# Patient Record
Sex: Male | Born: 1965 | Race: Black or African American | Hispanic: No | Marital: Married | Smoking: Never smoker
Health system: Southern US, Community
[De-identification: ages and names within clinical notes are randomized; demographics above are authoritative.]

## PROBLEM LIST (undated history)

## (undated) DIAGNOSIS — I10 Essential (primary) hypertension: Secondary | ICD-10-CM

## (undated) DIAGNOSIS — J45909 Unspecified asthma, uncomplicated: Secondary | ICD-10-CM

## (undated) DIAGNOSIS — E66813 Obesity, class 3: Secondary | ICD-10-CM

## (undated) HISTORY — DX: Unspecified asthma, uncomplicated: J45.909

## (undated) HISTORY — DX: Obesity, class 3: E66.813

## (undated) HISTORY — PX: MOUTH SURGERY: SHX715

## (undated) HISTORY — DX: Morbid (severe) obesity due to excess calories: E66.01

## (undated) HISTORY — PX: TONSILLECTOMY: SUR1361

---

## 2011-09-15 ENCOUNTER — Inpatient Hospital Stay (HOSPITAL_COMMUNITY)
Admission: EM | Admit: 2011-09-15 | Discharge: 2011-09-20 | DRG: 189 | Disposition: A | Discharge status: Home / Self Care | Payer: Medicaid Other | Attending: Pulmonary Disease | Admitting: Pulmonary Disease

## 2011-09-15 ENCOUNTER — Emergency Department (HOSPITAL_COMMUNITY): Payer: Medicaid Other

## 2011-09-15 ENCOUNTER — Encounter (HOSPITAL_COMMUNITY): Payer: Self-pay | Admitting: Emergency Medicine

## 2011-09-15 DIAGNOSIS — Z6841 Body Mass Index (BMI) 40.0 and over, adult: Secondary | ICD-10-CM

## 2011-09-15 DIAGNOSIS — R0989 Other specified symptoms and signs involving the circulatory and respiratory systems: Secondary | ICD-10-CM

## 2011-09-15 DIAGNOSIS — J9600 Acute respiratory failure, unspecified whether with hypoxia or hypercapnia: Principal | ICD-10-CM

## 2011-09-15 DIAGNOSIS — N179 Acute kidney failure, unspecified: Secondary | ICD-10-CM

## 2011-09-15 DIAGNOSIS — R0902 Hypoxemia: Secondary | ICD-10-CM

## 2011-09-15 DIAGNOSIS — R0603 Acute respiratory distress: Secondary | ICD-10-CM

## 2011-09-15 DIAGNOSIS — I10 Essential (primary) hypertension: Secondary | ICD-10-CM | POA: Diagnosis present

## 2011-09-15 DIAGNOSIS — R778 Other specified abnormalities of plasma proteins: Secondary | ICD-10-CM | POA: Diagnosis present

## 2011-09-15 DIAGNOSIS — I214 Non-ST elevation (NSTEMI) myocardial infarction: Secondary | ICD-10-CM

## 2011-09-15 DIAGNOSIS — I959 Hypotension, unspecified: Secondary | ICD-10-CM | POA: Diagnosis present

## 2011-09-15 DIAGNOSIS — R799 Abnormal finding of blood chemistry, unspecified: Secondary | ICD-10-CM | POA: Diagnosis present

## 2011-09-15 DIAGNOSIS — R34 Anuria and oliguria: Secondary | ICD-10-CM | POA: Diagnosis present

## 2011-09-15 DIAGNOSIS — R0609 Other forms of dyspnea: Secondary | ICD-10-CM

## 2011-09-15 DIAGNOSIS — J189 Pneumonia, unspecified organism: Secondary | ICD-10-CM

## 2011-09-15 DIAGNOSIS — I2699 Other pulmonary embolism without acute cor pulmonale: Secondary | ICD-10-CM | POA: Diagnosis present

## 2011-09-15 DIAGNOSIS — R7989 Other specified abnormal findings of blood chemistry: Secondary | ICD-10-CM

## 2011-09-15 HISTORY — DX: Essential (primary) hypertension: I10

## 2011-09-15 LAB — COMPREHENSIVE METABOLIC PANEL
ALT: 15 U/L (ref 0–53)
AST: 21 U/L (ref 0–37)
CO2: 22 mEq/L (ref 19–32)
Chloride: 100 mEq/L (ref 96–112)
Creatinine, Ser: 1.3 mg/dL (ref 0.50–1.35)
GFR calc Af Amer: 75 mL/min — ABNORMAL LOW (ref >90)
GFR calc non Af Amer: 65 mL/min — ABNORMAL LOW (ref >90)
Glucose, Bld: 175 mg/dL — ABNORMAL HIGH (ref 70–99)
Sodium: 138 mEq/L (ref 135–145)
Total Bilirubin: 0.4 mg/dL (ref 0.3–1.2)

## 2011-09-15 LAB — CARDIAC PANEL(CRET KIN+CKTOT+MB+TROPI)
CK, MB: 6.2 ng/mL (ref 0.3–4.0)
Relative Index: 2.4 (ref 0.0–2.5)
Troponin I: 1.52 ng/mL (ref <0.30)

## 2011-09-15 LAB — CBC
HCT: 38.3 % — ABNORMAL LOW (ref 39.0–52.0)
Hemoglobin: 12.2 g/dL — ABNORMAL LOW (ref 13.0–17.0)
MCH: 24.9 pg — ABNORMAL LOW (ref 26.0–34.0)
MCHC: 31.9 g/dL (ref 30.0–36.0)
RBC: 4.9 MIL/uL (ref 4.22–5.81)

## 2011-09-15 LAB — BLOOD GAS, ARTERIAL
Bicarbonate: 21.1 mEq/L (ref 20.0–24.0)
Expiratory PAP: 8
pH, Arterial: 7.378 (ref 7.350–7.450)
pO2, Arterial: 114 mmHg — ABNORMAL HIGH (ref 80.0–100.0)

## 2011-09-15 LAB — DIFFERENTIAL
Eosinophils Absolute: 0.1 10*3/uL (ref 0.0–0.7)
Lymphs Abs: 2.6 10*3/uL (ref 0.7–4.0)
Monocytes Absolute: 0.8 10*3/uL (ref 0.1–1.0)
Monocytes Relative: 4 % (ref 3–12)
Neutrophils Relative %: 82 % — ABNORMAL HIGH (ref 43–77)

## 2011-09-15 LAB — URINALYSIS, ROUTINE W REFLEX MICROSCOPIC
Ketones, ur: NEGATIVE mg/dL
Leukocytes, UA: NEGATIVE
Nitrite: NEGATIVE
Protein, ur: 100 mg/dL — AB
Urobilinogen, UA: 1 mg/dL (ref 0.0–1.0)

## 2011-09-15 LAB — PRO B NATRIURETIC PEPTIDE: Pro B Natriuretic peptide (BNP): 1144 pg/mL — ABNORMAL HIGH (ref 0–125)

## 2011-09-15 LAB — D-DIMER, QUANTITATIVE: D-Dimer, Quant: 12.67 ug/mL-FEU — ABNORMAL HIGH (ref 0.00–0.48)

## 2011-09-15 LAB — PROCALCITONIN: Procalcitonin: 1.64 ng/mL

## 2011-09-15 LAB — LACTIC ACID, PLASMA: Lactic Acid, Venous: 4.9 mmol/L — ABNORMAL HIGH (ref 0.5–2.2)

## 2011-09-15 LAB — URINE MICROSCOPIC-ADD ON

## 2011-09-15 MED ORDER — DEXTROSE 5 % IV SOLN
1.0000 g | Freq: Every day | INTRAVENOUS | Status: DC
  Administered 2011-09-15 – 2011-09-16 (×2): 1 g via INTRAVENOUS
  Filled 2011-09-15 (×2): qty 10

## 2011-09-15 MED ORDER — SODIUM CHLORIDE 0.9 % IV BOLUS (SEPSIS)
1000.0000 mL | Freq: Once | INTRAVENOUS | Status: AC
  Administered 2011-09-15: 1000 mL via INTRAVENOUS

## 2011-09-15 MED ORDER — PIPERACILLIN-TAZOBACTAM 3.375 G IVPB: 3.3750 g | Freq: Once | INTRAVENOUS | Status: DC

## 2011-09-15 MED ORDER — SODIUM CHLORIDE 0.9 % IV SOLN
INTRAVENOUS | Status: DC
  Administered 2011-09-15: 19:00:00 via INTRAVENOUS

## 2011-09-15 MED ORDER — PIPERACILLIN-TAZOBACTAM 3.375 G IVPB 30 MIN
3.3750 g | Freq: Once | INTRAVENOUS | Status: AC
  Administered 2011-09-15: 3.375 g via INTRAVENOUS
  Filled 2011-09-15: qty 50

## 2011-09-15 MED ORDER — SODIUM CHLORIDE 0.9 % IV SOLN: 250.0000 mL | INTRAVENOUS | Status: DC | PRN

## 2011-09-15 MED ORDER — SODIUM CHLORIDE 0.9 % IV SOLN
INTRAVENOUS | Status: DC
  Administered 2011-09-15 – 2011-09-17 (×5): via INTRAVENOUS

## 2011-09-15 MED ORDER — ASPIRIN 300 MG RE SUPP
300.0000 mg | RECTAL | Status: AC
  Filled 2011-09-15: qty 1

## 2011-09-15 MED ORDER — VANCOMYCIN HCL IN DEXTROSE 1-5 GM/200ML-% IV SOLN
1000.0000 mg | Freq: Once | INTRAVENOUS | Status: AC
  Administered 2011-09-15: 1000 mg via INTRAVENOUS
  Filled 2011-09-15: qty 200

## 2011-09-15 MED ORDER — HEPARIN (PORCINE) IN NACL 100-0.45 UNIT/ML-% IJ SOLN
2000.0000 [IU]/h | INTRAMUSCULAR | Status: DC
  Administered 2011-09-15 – 2011-09-16 (×3): 2000 [IU]/h via INTRAVENOUS
  Filled 2011-09-15 (×6): qty 250

## 2011-09-15 MED ORDER — HEPARIN BOLUS VIA INFUSION
5000.0000 [IU] | Freq: Once | INTRAVENOUS | Status: AC
  Administered 2011-09-15: 5000 [IU] via INTRAVENOUS
  Filled 2011-09-15: qty 5000

## 2011-09-15 MED ORDER — ALBUTEROL SULFATE (5 MG/ML) 0.5% IN NEBU
5.0000 mg | INHALATION_SOLUTION | Freq: Once | RESPIRATORY_TRACT | Status: AC
  Administered 2011-09-15: 5 mg via RESPIRATORY_TRACT
  Filled 2011-09-15: qty 1

## 2011-09-15 MED ORDER — DEXTROSE 5 % IV SOLN
500.0000 mg | INTRAVENOUS | Status: DC
  Administered 2011-09-15: 500 mg via INTRAVENOUS
  Filled 2011-09-15 (×2): qty 500

## 2011-09-15 MED ORDER — ASPIRIN 81 MG PO CHEW
324.0000 mg | CHEWABLE_TABLET | ORAL | Status: AC
  Administered 2011-09-15: 324 mg via ORAL
  Filled 2011-09-15: qty 4

## 2011-09-15 MED ORDER — ALBUTEROL SULFATE (5 MG/ML) 0.5% IN NEBU
2.5000 mg | INHALATION_SOLUTION | RESPIRATORY_TRACT | Status: DC
  Administered 2011-09-15 – 2011-09-16 (×3): 2.5 mg via RESPIRATORY_TRACT
  Filled 2011-09-15 (×4): qty 0.5

## 2011-09-15 MED ORDER — DEXTROSE 5 % IV SOLN: 500.0000 mg | INTRAVENOUS | Status: DC

## 2011-09-15 MED ORDER — IPRATROPIUM BROMIDE 0.02 % IN SOLN
0.5000 mg | Freq: Once | RESPIRATORY_TRACT | Status: AC
  Administered 2011-09-15: 0.5 mg via RESPIRATORY_TRACT
  Filled 2011-09-15: qty 2.5

## 2011-09-15 MED ORDER — IPRATROPIUM BROMIDE 0.02 % IN SOLN
0.5000 mg | RESPIRATORY_TRACT | Status: DC
  Administered 2011-09-15 – 2011-09-16 (×3): 0.5 mg via RESPIRATORY_TRACT
  Filled 2011-09-15 (×4): qty 2.5

## 2011-09-15 NOTE — Progress Notes (Signed)
CRITICAL VALUE ALERT  Critical value received:  CKMB 6.2 troponin 1.52  Date of notification:  09/15/11  Time of notification:  23:35  Critical value read back:yes  Nurse who received alert:  Haskell Flirt, RN MD notified (1st page):  [place]  Time of first page:  23:35  MD notified (2nd page):  Time of second page:  Responding MD:  Delton Coombes  Time MD responded:  23:35

## 2011-09-15 NOTE — ED Notes (Signed)
MD Tyson Alias aware of patient vital signs, 2nd fluid bolus initiated

## 2011-09-15 NOTE — ED Provider Notes (Signed)
History     CSN: [id number]  Arrival date & time 09/15/11  1704   First MD Initiated Contact with Patient 09/15/11 1708      Chief Complaint  Patient presents with  . Shortness of Breath    (Consider location/radiation/quality/duration/timing/severity/associated sxs/prior treatment) Patient is a 46 y.o. male presenting with shortness of breath. The history is provided by the patient.  Shortness of Breath  Associated symptoms include cough and shortness of breath. Pertinent negatives include no chest pain, no fever and no sore throat.  pt c/o progressive sob in past day. Non productive cough. No fever/chills. No chest pain or discomfort. Pt morbidly obese, states legs always swollen, denies recent change or leg pain. No dvt or pe hx. No hx chf. ?orthopnea. No pnd. Hx htn. Denies recent change in meds or non compliance w meds. Pt unaware of recent wt change. Sob at rest, worse w attempted walking or lying flat. Denies hx asthma or copd, denies allergy problems. No hx inhaler use.   Past Medical History  Diagnosis Date  . Hypertension     No past surgical history on file.  No family history on file.  History  Substance Use Topics  . Smoking status: Not on file  . Smokeless tobacco: Not on file  . Alcohol Use:       Review of Systems  Constitutional: Negative for fever and chills.  HENT: Negative for sore throat and neck pain.   Eyes: Negative for discharge and redness.  Respiratory: Positive for cough and shortness of breath.   Cardiovascular: Negative for chest pain.  Gastrointestinal: Negative for vomiting and abdominal pain.  Genitourinary: Negative for flank pain.  Musculoskeletal: Negative for back pain.  Skin: Negative for rash.  Neurological: Negative for headaches.  Hematological: Does not bruise/bleed easily.  Psychiatric/Behavioral: Negative for confusion.    Allergies  Review of patient's allergies indicates no known allergies.  Home Medications  No  current outpatient prescriptions on file.  BP 108/69  Pulse 132  Resp 32  SpO2 96%  Physical Exam  Nursing note and vitals reviewed. Constitutional: He is oriented to person, place, and time. He appears well-developed and well-nourished. He appears distressed.  HENT:  Head: Atraumatic.  Nose: Nose normal.  Mouth/Throat: Oropharynx is clear and moist.  Eyes: Pupils are equal, round, and reactive to light.  Neck: Neck supple. No tracheal deviation present.       No stiffness or rigidity  Cardiovascular: Normal rate, regular rhythm, normal heart sounds and intact distal pulses.  Exam reveals no gallop and no friction rub.   No murmur heard. Pulmonary/Chest: No accessory muscle usage. He is in respiratory distress.       Distant/difficult to hear breath sounds/morbidly obese, no focal rales, ?sl wheeze/diminished air exchange.   Abdominal: Soft. Bowel sounds are normal. He exhibits no distension. There is no tenderness.       Morbidly obese  Musculoskeletal: Normal range of motion.       Pt morbidly obese, bil leg swelling/symmetric.   Neurological: He is alert and oriented to person, place, and time.  Skin: Skin is warm and dry. He is not diaphoretic.    ED Course  Procedures (including critical care time)   Labs Reviewed  CBC  DIFFERENTIAL  TROPONIN I  PRO B NATRIURETIC PEPTIDE  COMPREHENSIVE METABOLIC PANEL  BLOOD GAS, ARTERIAL    Results for orders placed during the hospital encounter of 09/15/11  CBC      Component Value Range  WBC 19.6 (*) 4.0 - 10.5 (K/uL)   RBC 4.90  4.22 - 5.81 (MIL/uL)   Hemoglobin 12.2 (*) 13.0 - 17.0 (g/dL)   HCT 16.1 (*) 09.6 - 52.0 (%)   MCV 78.2  78.0 - 100.0 (fL)   MCH 24.9 (*) 26.0 - 34.0 (pg)   MCHC 31.9  30.0 - 36.0 (g/dL)   RDW 04.5  40.9 - 81.1 (%)   Platelets 316  150 - 400 (K/uL)  DIFFERENTIAL      Component Value Range   Neutrophils Relative 82 (*) 43 - 77 (%)   Neutro Abs 16.1 (*) 1.7 - 7.7 (K/uL)   Lymphocytes Relative  13  12 - 46 (%)   Lymphs Abs 2.6  0.7 - 4.0 (K/uL)   Monocytes Relative 4  3 - 12 (%)   Monocytes Absolute 0.8  0.1 - 1.0 (K/uL)   Eosinophils Relative 0  0 - 5 (%)   Eosinophils Absolute 0.1  0.0 - 0.7 (K/uL)   Basophils Relative 0  0 - 1 (%)   Basophils Absolute 0.0  0.0 - 0.1 (K/uL)  TROPONIN I      Component Value Range   Troponin I 0.58 (*) <0.30 (ng/mL)  PRO B NATRIURETIC PEPTIDE      Component Value Range   Pro B Natriuretic peptide (BNP) 1144.0 (*) 0 - 125 (pg/mL)  COMPREHENSIVE METABOLIC PANEL      Component Value Range   Sodium 138  135 - 145 (mEq/L)   Potassium 3.4 (*) 3.5 - 5.1 (mEq/L)   Chloride 100  96 - 112 (mEq/L)   CO2 22  19 - 32 (mEq/L)   Glucose, Bld 175 (*) 70 - 99 (mg/dL)   BUN 12  6 - 23 (mg/dL)   Creatinine, Ser 9.14  0.50 - 1.35 (mg/dL)   Calcium 8.8  8.4 - 78.2 (mg/dL)   Total Protein 7.5  6.0 - 8.3 (g/dL)   Albumin 3.4 (*) 3.5 - 5.2 (g/dL)   AST 21  0 - 37 (U/L)   ALT 15  0 - 53 (U/L)   Alkaline Phosphatase 70  39 - 117 (U/L)   Total Bilirubin 0.4  0.3 - 1.2 (mg/dL)   GFR calc non Af Amer 65 (*) >90 (mL/min)   GFR calc Af Amer 75 (*) >90 (mL/min)  BLOOD GAS, ARTERIAL      Component Value Range   FIO2 1.00     Delivery systems BILEVEL POSITIVE AIRWAY PRESSURE     Mode BILEVEL POSITIVE AIRWAY PRESSURE     Rate 10     Inspiratory PAP 16     Expiratory PAP 8     pH, Arterial 7.378  7.350 - 7.450    pCO2 arterial 36.8  35.0 - 45.0 (mmHg)   pO2, Arterial 114.0 (*) 80.0 - 100.0 (mmHg)   Bicarbonate 21.1  20.0 - 24.0 (mEq/L)   TCO2 19.2  0 - 100 (mmol/L)   Acid-base deficit 3.0 (*) 0.0 - 2.0 (mmol/L)   O2 Saturation 97.1     Patient temperature 98.6     Collection site RIGHT RADIAL     Drawn by [id number]     Sample type ARTERIAL DRAW     Allens test (pass/fail) PASS  PASS   D-DIMER, QUANTITATIVE      Component Value Range   D-Dimer, Quant 12.67 (*) 0.00 - 0.48 (ug/mL-FEU)  LACTIC ACID, PLASMA      Component Value Range   Lactic Acid, Venous 4.9  (*)  0.5 - 2.2 (mmol/L)   Dg Chest Port 1 View  09/15/2011  *RADIOLOGY REPORT*  Clinical Data: Shortness of breath.  Respiratory distress.  PORTABLE CHEST - 1 VIEW  Comparison: None.  Findings: There is slight left perihilar haziness which may represent an infiltrate.  Suggestion of cardiomegaly.  Pulmonary vascularity is normal.  Right lung is clear.  IMPRESSION: Possible left perihilar infiltrate.  Cardiomegaly.  Original Report Authenticated By: Gwynn Burly, M.D.      MDM  Iv ns. Continuous pulse ox and monitor.  Stat pcxr. Labs. Additional hx from nursing and EMS.  Pt states progressive sob in past day. Denies hx same. No chest pain. Non productive cough today.     Date: 09/15/2011  Rate: 125  Rhythm: sinus tachycardia  QRS Axis: normal  Intervals: normal  ST/T Wave abnormalities: nonspecific ST/T changes  Conduction Disutrbances:none  Narrative Interpretation:   Old EKG Reviewed: none available  ?mild wheezing, albuterol/atrovent neb.  cxr with ?infiltrate on left, pt notes cough, will rx abx.  Zosyn and vanc iv.   Nursing notes now bp lower 85/50. Rechecked - low, ns boluses. pccm md on call paged - they will see in ed.   Recheck bp improved post ns bolus.   Recheck pt several times, remains conscious and alert, resp status appears relatively stable on bipap.  Additional hx from spouse now present, states pt w worsening cough for past several days, had felt bad/ill for several days. Given hx progressive cough, infiltrate on cxr, low bp - feel symptoms likely result of pna.   Given wt, cannot get ct chest. Critical care aware of labs (ddimer, trop) they will decide on anticoag.   Pt continues to deny any current or recent chest pain or discomfort.  CRITICAL CARE Performed by: Suzi Roots   Total critical care time: 100 minutes  Critical care time was exclusive of separately billable procedures and treating other patients.  Critical care was necessary to  treat or prevent imminent or life-threatening deterioration.  Critical care was time spent personally by me on the following activities: development of treatment plan with patient and/or surrogate as well as nursing, discussions with consultants, evaluation of patient's response to treatment, examination of patient, obtaining history from patient or surrogate, ordering and performing treatments and interventions, ordering and review of laboratory studies, ordering and review of radiographic studies, pulse oximetry and re-evaluation of patient's condition.        Suzi Roots, MD 09/15/11 [phone]

## 2011-09-15 NOTE — ED Notes (Signed)
Pt taken off bipap by MD, placed on NRB mask at 100%, respiratory at bedside, MD at bedside assessing patient

## 2011-09-15 NOTE — ED Notes (Signed)
MD at bedside. 

## 2011-09-15 NOTE — ED Notes (Signed)
Pt unable to answer triage questions due to respiratory distress.

## 2011-09-15 NOTE — H&P (Addendum)
Name: Seth Wilson MRN: [id number] DOB: November 27, 1965    LOS: 0  Referring Provider:  Denton Lank Reason for Referral:  Hypoxemic Respiratory Failure  PULMONARY / CRITICAL CARE MEDICINE  HPI:  46 y/o morbidly obese male with hypertension presented to [facility] on 5/19 in marked respiratory distress, hypoxemia and hypotension.  He was started on BIPAP, given IVF, and antibiotics and PCCM was called for admission.  He states that in the last week he had been feeling more short of breath and had fever, chills, and a cough productive of clear sputum.  He flew to [place] on 5/17 for a one day trip (after these symptoms had started) and felt worse after he returned.  He noted no new leg pain or chest pain after the flight.  He presented to the ED on 5/19 due to increasing dyspnea.  After receiving BIPAP, IVF and anti-biotics he was feeling much better.  He notes chronic leg edema which may be worse in the last few weeks.  Past Medical History  Diagnosis Date  . Hypertension    No past surgical history on file. Prior to Admission medications   Medication Sig Start Date End Date Taking? Authorizing Provider  amLODipine (NORVASC) 10 MG tablet Take 10 mg by mouth daily.   Yes Historical Provider, MD  Chlorphen-Phenyleph-ASA (ALKA-SELTZER PLUS COLD) 2-7.8-325 MG TBEF Take 2 tablets by mouth every 6 (six) hours as needed. For cold   Yes Historical Provider, MD  Furosemide (LASIX PO) Take 1 tablet by mouth daily.   Yes Historical Provider, MD   Allergies No Known Allergies  Family History No family history on file. Social History  does not have a smoking history on file. He does not have any smokeless tobacco history on file. His alcohol and drug histories not on file.  Review Of Systems:   Gen: Notes fever and chills, denies weight change, fatigue, night sweats HEENT: Denies blurred vision, double vision, hearing loss, tinnitus, sinus congestion, rhinorrhea, sore throat, neck stiffness, dysphagia PULM: per  hpi CV: Denies chest pain, notes some leg edema, notes some orthopnea, denies paroxysmal nocturnal dyspnea, palpitations GI: Denies abdominal pain, nausea, vomiting, diarrhea, hematochezia, melena, constipation, change in bowel habits GU: Denies dysuria, hematuria, polyuria, notes oliguria in the ED, urethral discharge Endocrine: Denies hot or cold intolerance, polyuria, polyphagia or appetite change Derm: Denies rash, dry skin, scaling or peeling skin change Heme: Denies easy bruising, bleeding, bleeding gums Neuro: Denies headache, numbness, weakness, slurred speech, loss of memory or consciousness   Brief patient description:   46 y/o obese male with hypoxemic respiratory failure admitted on 5/19 from the [facility]  His chills, elevated WBC and possible LUL infiltrate suggest community acquired pneumonia, but PE cannot be excluded.    Events Since Admission:   Current Status:  Vital Signs: Temp:  [100.2 F (37.9 C)-100.6 F (38.1 C)] 100.4 F (38 C) (05/19 2025) Pulse Rate:  [114-140] 114  (05/19 2025) Resp:  [20-40] 22  (05/19 2025) BP: (74-117)/(11-101) 117/101 mmHg (05/19 2025) SpO2:  [61 %-100 %] 96 % (05/19 2025)  Physical Examination: General:  Obese male, tachypnic but speaking in full sentences, no accessory muscle use Neuro:  Awake and alert, oriented x4, maew HEENT:  NCAT, PERRL, OP clear, MMC 4 Neck:  Supple, no masses Cardiovascular:  Tachy, regular, distant heart sounds, cannot assess JVD Lungs:  Insp crackles in bases bilaterally Abdomen:  Obese, bs+, soft, nontender Musculoskeletal:  Warm, good strength Skin:  Edema in legs bilaterally, no rash or  breakdown  Principal Problem:  *Acute respiratory failure Active Problems:  Community acquired pneumonia  Hypotension  Morbid obesity  Acute renal failure  NSTEMI (non-ST elevated myocardial infarction)   ASSESSMENT AND PLAN  PULMONARY  Lab 09/15/11 1735  PHART 7.378  PCO2ART 36.8  PO2ART 114.0*  HCO3  21.1  O2SAT 97.1   Ventilator Settings:   CXR:  Bilateral increased congestion, possible infiltrate LUL ETT:  n/a  A:  Acute hypoxemic respiratory failure in setting of cough, infiltrate and fever consistent with community acquired pneumonia.  However elevated d-dimer, morbid obesity and recent plane flight worrisome for acute PE.  Also with elevated troponin and BNP which either represents an acute PE or less likely acute coronary ischaemia with heart failure.  Overall, I think the most likely explanation is CAP with RV strain either from PE or perhaps chronic hypoxemia induced PH.  P:   -blood cultures, bio markers, and antibiotics as below -BIPAP off, he is much more comfortable since presentation to ED -f/u abg in am -wean off O2 as tolerated -empirically anticoagulate for PE and f/u results of TTE and LE doppler ultrasound  CARDIOVASCULAR  Lab 09/15/11 1800 09/15/11 1749  TROPONINI 0.58* --  LATICACIDVEN -- 4.9*  PROBNP 1144.0* --   ECG:  Sinus tach, ST depression in inferior leads Lines:  5/20 LE dopper U/S >>  A: Shock; ddx includes septic shock vs. Pulmonary embolism vs. Right sided MI.  Favor sepsis.  Blood pressure and heart rate responding well to IVF and patient strongly requesting that central line not be placed considering his improvement.   P:  -hold off on CVL for now but will monitor response to IVF -repeat lactic acid -cycle enzymes -serial EKG -tele -repeat lactic acid to ensure clearance -empirically anticoagulate with heparin gtt and f/u LE dopler and TTE -monitor volume status / respiratory status closely with IVF resuscitation  RENAL  Lab 09/15/11 1747  NA 138  K 3.4*  CL 100  CO2 22  BUN 12  CREATININE 1.30  CALCIUM 8.8  MG --  PHOS --   Intake/Output    None    Foley:  5/19  A:  Oliguric renal failure, likely due to sepsis P:   -IVF resuscitation -Monitor UOP  GASTROINTESTINAL  Lab 09/15/11 1747  AST 21  ALT 15  ALKPHOS 70   BILITOT 0.4  PROT 7.5  ALBUMIN 3.4*    A:  No acute issues P:   -npo overnight -if respiratory status improved then allow to eat in AM  HEMATOLOGIC  Lab 09/15/11 1747  HGB 12.2*  HCT 38.3*  PLT 316  INR --  APTT --   A:  No acute issues P:  -daily cbc  INFECTIOUS  Lab 09/15/11 1747  WBC 19.6*  PROCALCITON --   Cultures: 5/19 blood cx >> 5/19 urine str >> 5/19 urine leg >>  Antibiotics: 5/19 vanc x1 5/19 zosyn x1 5/19 azithro >> 5/19 ceftriaxone >>  A:  CAP with severe sepsis P:   -IV resusciation -check procalcitonin -hold off on sepsis protocol unless pressure no responsive to IVF resuscitation  ENDOCRINE No results found for this basename: GLUCAP:5 in the last 168 hours A:  No acute issues P:   -monitor glucose  NEUROLOGIC  A:  No acute issues P:   -monitor neuro status  BEST PRACTICE / DISPOSITION - Level of Care:  ICU - Primary Service:  PCCM - Consultants:   - Code Status:  Full - Diet:  NPO - DVT Px:  Hep gtt - GI Px:  Not indicated - Skin Integrity:  No breakdown - Social / Family:  Updated at bedside  Total CC time 60 minutes  Max Fickle, M.D. [facility] Pager: [phone] Cell: [phone] If no response, call [phone]  09/15/2011, 8:34 PM

## 2011-09-15 NOTE — Progress Notes (Signed)
ANTICOAGULATION CONSULT NOTE - Initial Consult  Pharmacy Consult for Heparin/ceftriaxone Indication: PNA; R/O PE  No Known Allergies  Patient Measurements: Height: 6\' 2"  (188 cm) Weight: 612 lb 10.5 oz (277.9 kg) IBW/kg (Calculated) : 82.2  Heparin Dosing Weight:   Vital Signs: Temp: 100.2 F (37.9 C) (05/19 2205) Temp src: Core (Comment) (05/19 1837) BP: 120/83 mmHg (05/19 2205) Pulse Rate: 109  (05/19 2205)  Labs:  Basename 09/15/11 1800 09/15/11 1747  HGB -- 12.2*  HCT -- 38.3*  PLT -- 316  APTT -- --  LABPROT -- --  INR -- --  HEPARINUNFRC -- --  CREATININE -- 1.30  CKTOTAL -- --  CKMB -- --  TROPONINI 0.58* --    Estimated Creatinine Clearance: 162.9 ml/min (by C-G formula based on Cr of 1.3).   Medical History: Past Medical History  Diagnosis Date  . Hypertension     Medications:  Anti-infectives     Start     Dose/Rate Route Frequency Ordered Stop   09/15/11 2230   cefTRIAXone (ROCEPHIN) 1 g in dextrose 5 % 50 mL IVPB        1 g 100 mL/hr over 30 Minutes Intravenous Daily at bedtime 09/15/11 2215     09/15/11 2215   azithromycin (ZITHROMAX) 500 mg in dextrose 5 % 250 mL IVPB  Status:  Discontinued        500 mg 250 mL/hr over 60 Minutes Intravenous Every 24 hours 09/15/11 2206 09/15/11 2214   09/15/11 1930   vancomycin (VANCOCIN) IVPB 1000 mg/200 mL premix        1,000 mg 200 mL/hr over 60 Minutes Intravenous  Once 09/15/11 1756 09/15/11 2105   09/15/11 1930  piperacillin-tazobactam (ZOSYN) IVPB 3.375 g       3.375 g 100 mL/hr over 30 Minutes Intravenous  Once 09/15/11 1917 09/15/11 2000   09/15/11 1815   azithromycin (ZITHROMAX) 500 mg in dextrose 5 % 250 mL IVPB        500 mg 250 mL/hr over 60 Minutes Intravenous Every 24 hours 09/15/11 1814     09/15/11 1800   piperacillin-tazobactam (ZOSYN) IVPB 3.375 g  Status:  Discontinued        3.375 g 12.5 mL/hr over 240 Minutes Intravenous  Once 09/15/11 1756 09/15/11 1916           Assessment: Patient with SOB.  MD notes PNA or PE with more likely PNA/sepsis.  Spoke with MD concerning patient's weight and dosing heparin for PE.  Will dose less aggressively and limit heparin to 2000 units/hr starting and then base dosing on levels, to reduce risk of bleeding.  Goal of Therapy:  Heparin level 0.3-0.7 units/ml Monitor platelets by anticoagulation protocol: Yes Rocephin based on standard dosing   Plan:  Heparin bolus 5000 units iv x1, then 2000 units/hr  Check heparin level in 6hr Rocephin 1gm iv q24hr  Aleene Davidson Crowford 09/15/2011,10:42 PM

## 2011-09-15 NOTE — ED Notes (Signed)
Per EMS: Pt was in [place] visiting son.  Got of the car and was walking to the apartment and became short of breath. NRB placed. 12 lead done en route.  Sinus Tach on the monitor.

## 2011-09-16 ENCOUNTER — Inpatient Hospital Stay (HOSPITAL_COMMUNITY): Payer: Medicaid Other

## 2011-09-16 DIAGNOSIS — R0602 Shortness of breath: Secondary | ICD-10-CM

## 2011-09-16 DIAGNOSIS — R778 Other specified abnormalities of plasma proteins: Secondary | ICD-10-CM | POA: Diagnosis present

## 2011-09-16 DIAGNOSIS — I959 Hypotension, unspecified: Secondary | ICD-10-CM

## 2011-09-16 LAB — BLOOD GAS, ARTERIAL
Acid-base deficit: 2.9 mmol/L — ABNORMAL HIGH (ref 0.0–2.0)
Bicarbonate: 20.5 mEq/L (ref 20.0–24.0)
Drawn by: 308601
FIO2: 0.5 %
O2 Saturation: 94.5 %
TCO2: 18.6 mmol/L (ref 0–100)
pCO2 arterial: 32.6 mmHg — ABNORMAL LOW (ref 35.0–45.0)
pO2, Arterial: 70.5 mmHg — ABNORMAL LOW (ref 80.0–100.0)

## 2011-09-16 LAB — CBC
MCH: 24.9 pg — ABNORMAL LOW (ref 26.0–34.0)
MCHC: 32.1 g/dL (ref 30.0–36.0)
RDW: 14.9 % (ref 11.5–15.5)

## 2011-09-16 LAB — LEGIONELLA ANTIGEN, URINE: Legionella Antigen, Urine: NEGATIVE

## 2011-09-16 LAB — STREP PNEUMONIAE URINARY ANTIGEN: Strep Pneumo Urinary Antigen: NEGATIVE

## 2011-09-16 LAB — PROCALCITONIN: Procalcitonin: 1.78 ng/mL

## 2011-09-16 LAB — BASIC METABOLIC PANEL
BUN: 19 mg/dL (ref 6–23)
CO2: 22 mEq/L (ref 19–32)
Calcium: 8.4 mg/dL (ref 8.4–10.5)
Creatinine, Ser: 1.88 mg/dL — ABNORMAL HIGH (ref 0.50–1.35)

## 2011-09-16 LAB — HEPARIN LEVEL (UNFRACTIONATED)
Heparin Unfractionated: 0.54 IU/mL (ref 0.30–0.70)
Heparin Unfractionated: 0.54 IU/mL (ref 0.30–0.70)

## 2011-09-16 LAB — CARDIAC PANEL(CRET KIN+CKTOT+MB+TROPI)
CK, MB: 9.7 ng/mL (ref 0.3–4.0)
Troponin I: 2.14 ng/mL (ref <0.30)

## 2011-09-16 LAB — LACTIC ACID, PLASMA: Lactic Acid, Venous: 2.3 mmol/L — ABNORMAL HIGH (ref 0.5–2.2)

## 2011-09-16 MED ORDER — IPRATROPIUM BROMIDE 0.02 % IN SOLN: 0.5000 mg | RESPIRATORY_TRACT | Status: DC | PRN

## 2011-09-16 MED ORDER — MOXIFLOXACIN HCL 400 MG PO TABS
400.0000 mg | ORAL_TABLET | Freq: Every day | ORAL | Status: DC
  Administered 2011-09-16 – 2011-09-19 (×4): 400 mg via ORAL
  Filled 2011-09-16 (×6): qty 1

## 2011-09-16 MED ORDER — ALBUTEROL SULFATE (5 MG/ML) 0.5% IN NEBU: 2.5000 mg | INHALATION_SOLUTION | RESPIRATORY_TRACT | Status: DC

## 2011-09-16 MED ORDER — ASPIRIN EC 325 MG PO TBEC
325.0000 mg | DELAYED_RELEASE_TABLET | Freq: Every day | ORAL | Status: DC
  Administered 2011-09-16 – 2011-09-20 (×5): 325 mg via ORAL
  Filled 2011-09-16 (×5): qty 1

## 2011-09-16 MED ORDER — PATIENT'S GUIDE TO USING COUMADIN BOOK
Freq: Once | Status: AC
  Administered 2011-09-16: 18:00:00
  Filled 2011-09-16: qty 1

## 2011-09-16 MED ORDER — WARFARIN - PHARMACIST DOSING INPATIENT: Freq: Every day | Status: DC

## 2011-09-16 MED ORDER — WARFARIN SODIUM 10 MG PO TABS
10.0000 mg | ORAL_TABLET | Freq: Once | ORAL | Status: AC
  Administered 2011-09-16: 10 mg via ORAL
  Filled 2011-09-16: qty 1

## 2011-09-16 MED ORDER — WARFARIN VIDEO: Freq: Once | Status: DC

## 2011-09-16 MED ORDER — ASPIRIN EC 81 MG PO TBEC: 81.0000 mg | DELAYED_RELEASE_TABLET | Freq: Every day | ORAL | Status: DC

## 2011-09-16 MED ORDER — WARFARIN VIDEO
Freq: Once | Status: AC
  Administered 2011-09-17: 10:00:00

## 2011-09-16 MED ORDER — ALBUTEROL SULFATE (5 MG/ML) 0.5% IN NEBU: 2.5000 mg | INHALATION_SOLUTION | RESPIRATORY_TRACT | Status: DC | PRN

## 2011-09-16 NOTE — Progress Notes (Signed)
Name: Seth Wilson MRN: [id number] DOB: 1965/07/16    LOS: 1  Referring Provider:  Denton Lank Reason for Referral:  Hypoxemic Respiratory Failure  PULMONARY / CRITICAL CARE MEDICINE  Brief patient description:   46 y/o obese male with hypoxemic respiratory failure admitted on 5/19 from the [facility].  His chills, elevated WBC and possible LUL infiltrate suggest community acquired pneumonia, but PE cannot be excluded.    Events Since Admission:   Vital Signs: Temp:  [97.9 F (36.6 C)-100.6 F (38.1 C)] 99.3 F (37.4 C) (05/20 1000) Pulse Rate:  [98-140] 98  (05/20 1000) Resp:  [20-40] 25  (05/20 1000) BP: (74-141)/(11-101) 122/84 mmHg (05/20 1000) SpO2:  [61 %-100 %] 89 % (05/20 1000) FiO2 (%):  [50 %-100 %] 50 % (05/20 0441) Weight:  [612 lb 10.5 oz (277.9 kg)] 612 lb 10.5 oz (277.9 kg) (05/19 2226)  Physical Examination: General:  Obese male, tachypnic but speaking in full sentences, no accessory muscle use Neuro:  Awake and alert, oriented x4, maew HEENT:  NCAT, PERRL, OP clear Neck:  Supple, no masses Cardiovascular:  Tachy, regular, distant heart sounds, cannot assess JVD Lungs:  Insp crackles in bases bilaterally Abdomen:  Obese, bs+, soft, nontender Musculoskeletal:  Warm, good strength Skin:  Edema in legs bilaterally, no rash or breakdown, chronic venous changes  Principal Problem:  *Acute on chronic respiratory failure Active Problems:  Possible Community acquired pneumonia  Likely PE - too heavy for CTA chest or V/Q scan  Likely OSA  Hypotension - resolved  Morbid obesity  Acute renal failure  Abnormal cardiac markers   ASSESSMENT AND PLAN  PULMONARY  Lab 09/16/11 0444 09/15/11 1735  PHART 7.414 7.378  PCO2ART 32.6* 36.8  PO2ART 70.5* 114.0*  HCO3 20.5 21.1  O2SAT 94.5 97.1   Ventilator Settings: Vent Mode:  [-]  FiO2 (%):  [50 %-100 %] 50 % CXR:  Bilateral increased congestion, possible infiltrate LUL ETT:  n/a  A:  Acute hypoxemic respiratory  failure in setting of cough, infiltrate and fever consistent with community acquired pneumonia. However elevated d-dimer, morbid obesity and recent plane flight worrisome for acute PE.  Also with elevated troponin and BNP which either represents an acute PE or less likely acute coronary ischemia with heart failure.  Overall,  most likely explanation is CAP with RV strain either from PE or perhaps chronic hypoxemia induced PH.  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 Elevated TpI and BNP - this would be compatible with large PE.    P:  -cycle enzymes, trop peak on 3rd 2.14, will assess one more set -serial EKG - Cont ASA -Cont tele - Will consider Cards eval -repeat lactic acid to ensure clearance -empirically anticoagulate with heparin gtt and f/u LE Doppler and TTE -monitor volume status / respiratory status closely with IVF resuscitation  RENAL  Lab 09/16/11 0625 09/15/11 1747  NA 136 138  K 4.2 3.4*  CL 102 100  CO2 22 22  BUN 19 12  CREATININE 1.88* 1.30  CALCIUM 8.4 8.8  MG -- --  PHOS -- --   Intake/Output      05/19 0701 - 05/20 0700 05/20 0701 - 05/21 0700   I.V. (mL/kg) 1575 (5.7)    IV Piggyback 50    Total Intake(mL/kg) 1625 (5.8)    Urine (mL/kg/hr) 170 (0)    Total Output 170    Net +1455         Stool Occurrence 1 x     Foley:  5/19  A:  Oliguric renal failure, likely due to  hypotension P:   -IVF resuscitation -Monitor UOP  GASTROINTESTINAL  Lab 09/15/11 1747  AST 21  ALT 15  ALKPHOS 70  BILITOT 0.4  PROT 7.5  ALBUMIN 3.4*    A:  No acute issues P:   -carb modified diet   INFECTIOUS  Lab 09/16/11 0625 09/15/11 2229 09/15/11 1747  WBC 14.1* -- 19.6*  PROCALCITON -- 1.64 --   Cultures: 5/19 blood cx >> 5/19 urine str>>neg 5/19 urine leg >>  Antibiotics: 5/19 vanc x1 5/19 zosyn x1 5/19 azithro >> 5/20 5/19 ceftriaxone >> 5/20 Avelox >>   A:  Possible CAP P:   -IV resuscitation -f/u procalcitonin and lactate     BEST PRACTICE / DISPOSITION - Level of Care:  ICU - Primary Service:  PCCM - Consultants:   - Code Status:  Full - Diet:  CHO modified - DVT Px:  Hep gtt - GI Px:  Not indicated - Skin Integrity:  No breakdown - Social / Family:  Updated at bedside   Canary Brim, NP-C [facility] Pgr: [phone]  09/16/2011, 10:42 AM  Seen on CCM rounds this morning with resident MD or ACNP above.  Pt examined and database reviewed. I agree with above findings, assessment and plan as reflected in the note above. There are no diagnostic options for ruling out PE and he is at extremely high risk for such with a presentation very compatible with this dx. He will need to be treated empirically for PE and should probably remain on prolonged therapy until it can be demonstrated that his risk factors (obesity, chronic RV failure) are sufficiently resolved  Billy Fischer, MD;  PCCM service; Mobile [phone]

## 2011-09-16 NOTE — Progress Notes (Signed)
  Echocardiogram 2D Echocardiogram has been performed.  [name] 09/16/2011, 9:51 AM

## 2011-09-16 NOTE — Progress Notes (Signed)
VASCULAR LAB PRELIMINARY  PRELIMINARY  PRELIMINARY  PRELIMINARY  Bilateral lower extremity venous duplex attempted.  Preliminary report:  Severely limited study due to morbid obesity and respiratory interference. Unable to visualize only small regions of the thigh, popliteal fossa, calf, and distal ankle. Areas imaged revealed no obvious evidence of DVT. Results are inconclusive due to limitations.  Troi Florendo D, RVS 09/16/2011, 1:00 PM

## 2011-09-16 NOTE — Progress Notes (Signed)
ANTICOAGULATION CONSULT NOTE - Follow Up Consult  Pharmacy Consult for Heparin Indication: pulmonary embolus  No Known Allergies  Patient Measurements: Height: 6\' 2"  (188 cm) Weight: 612 lb 10.5 oz (277.9 kg) IBW/kg (Calculated) : 82.2  Heparin Dosing Weight:   Vital Signs: Temp: 100.2 F (37.9 C) (05/20 0625) Temp src: Core (Comment) (05/20 0400) BP: 126/61 mmHg (05/20 0620) Pulse Rate: 108  (05/20 0625)  Labs:  Basename 09/16/11 0625 09/15/11 2229 09/15/11 1800 09/15/11 1747  HGB 11.2* -- -- 12.2*  HCT 34.9* -- -- 38.3*  PLT 277 -- -- 316  APTT -- -- -- --  LABPROT -- -- -- --  INR -- -- -- --  HEPARINUNFRC 0.54 -- -- --  CREATININE -- -- -- 1.30  CKTOTAL -- 255* -- --  CKMB -- 6.2* -- --  TROPONINI -- 1.52* 0.58* --    Estimated Creatinine Clearance: 162.9 ml/min (by C-G formula based on Cr of 1.3).   Medications:  Infusions:    . sodium chloride 50 mL/hr at 09/15/11 1843  . sodium chloride 150 mL/hr at 09/16/11 0232  . heparin 2,000 Units/hr (09/15/11 2258)    Assessment: Patient with 1st level at goal, no issues per RN.  Goal of Therapy:  Heparin level 0.3-0.7 units/ml Monitor platelets by anticoagulation protocol: Yes   Plan:  Continue with current rate, recheck at [address], Seth Wilson 09/16/2011,7:03 AM

## 2011-09-16 NOTE — Progress Notes (Signed)
ANTICOAGULATION CONSULT NOTE - Follow Up Consult  Pharmacy Consult for Heparin and Coumadin Indication: pulmonary embolus  No Known Allergies  Patient Measurements: Height: 6\' 2"  (188 cm) Weight: 612 lb 10.5 oz (277.9 kg) IBW/kg (Calculated) : 82.2  Heparin Dosing Weight: 155kg  Vital Signs: Temp: 99.5 F (37.5 C) (05/20 1300) Temp src: Axillary (05/20 0800) BP: 144/83 mmHg (05/20 1100) Pulse Rate: 102  (05/20 1300)  Labs:  Basename 09/16/11 1425 09/16/11 1420 09/16/11 1231 09/16/11 0625 09/15/11 2229 09/15/11 1747  HGB -- -- -- 11.2* -- 12.2*  HCT -- -- -- 34.9* -- 38.3*  PLT -- -- -- 277 -- 316  APTT -- -- -- -- -- --  LABPROT 15.3* -- -- -- -- --  INR 1.18 -- -- -- -- --  HEPARINUNFRC -- 0.54 -- 0.54 -- --  CREATININE -- -- -- 1.88* -- 1.30  CKTOTAL -- -- 282* 276* 255* --  CKMB -- -- 9.8* 9.7* 6.2* --  TROPONINI -- -- 1.63* 2.14* 1.52* --    Estimated Creatinine Clearance: 112.6 ml/min (by C-G formula based on Cr of 1.88).   Medications:  Scheduled:    . albuterol  5 mg Nebulization Once  . aspirin  324 mg Oral NOW   Or  . aspirin  300 mg Rectal NOW  . cefTRIAXone (ROCEPHIN)  IV  1 g Intravenous QHS  . heparin  5,000 Units Intravenous Once  . ipratropium  0.5 mg Nebulization Once  . moxifloxacin  400 mg Oral q1800  . piperacillin-tazobactam  3.375 g Intravenous Once  . sodium chloride  1,000 mL Intravenous Once  . sodium chloride  1,000 mL Intravenous Once  . vancomycin  1,000 mg Intravenous Once  . DISCONTD: albuterol  2.5 mg Nebulization Q4H  . DISCONTD: albuterol  2.5 mg Nebulization Q4H  . DISCONTD: azithromycin  500 mg Intravenous Q24H  . DISCONTD: azithromycin  500 mg Intravenous Q24H  . DISCONTD: ipratropium  0.5 mg Nebulization Q4H  . DISCONTD: piperacillin-tazobactam (ZOSYN)  IV  3.375 g Intravenous Once    Assessment:  45yo obese M admitted with hypotension and resp failure. Recent air travel.   Started heparin infusion on 5/19 for  suspected PE(D-dimer was elevated & unable to get CT d/t his size).  Also suspected NSTEMI(elevated cardiac enzymes).  Adding Coumadin today. Will need 5 day overlap and therapeutic INR x 24hrs.  Heparin level in target range on 2000units/hr. No bleeding reported/documented.  Goal of Therapy:  INR 2-3 Heparin level 0.3-0.7 units/ml Monitor platelets by anticoagulation protocol: Yes   Plan:   Cont sam heparin.  Give Coumadin 10mg  today.  F/u daily heparin level and INR.  Provide Coumadin education.  Consider switching heparin to Lovenox.   Charolotte Eke, PharmD, pager [phone]. 09/16/2011,3:17 PM.

## 2011-09-16 NOTE — Progress Notes (Signed)
CARE MANAGEMENT NOTE 09/16/2011  Patient:  BAPTISTE, LITTLER   Account Number:  [id number]  Date Initiated:  09/16/2011  Documentation initiated by:  Clella Mckeel  Subjective/Objective Assessment:   pt admitted through er with temp and poss sepsis verus pnuemonia resp failure and hypoxia     Action/Plan:   li9ves at home   Anticipated DC Date:  09/19/2011   Anticipated DC Plan:  HOME/SELF CARE  In-house referral  NA      DC Planning Services  NA      [facility] Choice  NA   Choice offered to / List presented to:  NA   DME arranged  NA      DME agency  NA     HH arranged  NA      HH agency  NA   Status of service:  In process, will continue to follow Medicare Important Message given?  NA - LOS <3 / Initial given by admissions (If response is "NO", the following Medicare IM given date fields will be blank) Date Medicare IM given:   Date Additional Medicare IM given:    Discharge Disposition:    Per UR Regulation:  Reviewed for med. necessity/level of care/duration of stay  If discussed at Long Length of Stay Meetings, dates discussed:    Comments:  [id number]/Dodi Leu Earlene Plater, RN, BSN, CCM No discharge needs present at time of this review at the sdu/icu level. Case Management [phone]

## 2011-09-17 ENCOUNTER — Inpatient Hospital Stay (HOSPITAL_COMMUNITY): Payer: Medicaid Other

## 2011-09-17 DIAGNOSIS — I2699 Other pulmonary embolism without acute cor pulmonale: Secondary | ICD-10-CM

## 2011-09-17 LAB — CBC
HCT: 33.8 % — ABNORMAL LOW (ref 39.0–52.0)
MCH: 24.8 pg — ABNORMAL LOW (ref 26.0–34.0)
MCHC: 31.7 g/dL (ref 30.0–36.0)
RDW: 15 % (ref 11.5–15.5)

## 2011-09-17 LAB — URINE CULTURE: Colony Count: NO GROWTH

## 2011-09-17 LAB — BASIC METABOLIC PANEL
BUN: 21 mg/dL (ref 6–23)
Calcium: 8.6 mg/dL (ref 8.4–10.5)
Creatinine, Ser: 1.68 mg/dL — ABNORMAL HIGH (ref 0.50–1.35)
GFR calc Af Amer: 55 mL/min — ABNORMAL LOW (ref >90)
GFR calc non Af Amer: 48 mL/min — ABNORMAL LOW (ref >90)
Potassium: 4.5 mEq/L (ref 3.5–5.1)

## 2011-09-17 LAB — HEPARIN LEVEL (UNFRACTIONATED)
Heparin Unfractionated: 0.27 IU/mL — ABNORMAL LOW (ref 0.30–0.70)
Heparin Unfractionated: 0.38 IU/mL (ref 0.30–0.70)

## 2011-09-17 MED ORDER — WARFARIN SODIUM 10 MG PO TABS
10.0000 mg | ORAL_TABLET | Freq: Once | ORAL | Status: AC
  Administered 2011-09-17: 10 mg via ORAL
  Filled 2011-09-17 (×2): qty 1

## 2011-09-17 MED ORDER — HEPARIN BOLUS VIA INFUSION
2000.0000 [IU] | Freq: Once | INTRAVENOUS | Status: AC
  Administered 2011-09-17: 2000 [IU] via INTRAVENOUS
  Filled 2011-09-17: qty 2000

## 2011-09-17 MED ORDER — HEPARIN (PORCINE) IN NACL 100-0.45 UNIT/ML-% IJ SOLN
2400.0000 [IU]/h | INTRAMUSCULAR | Status: DC
  Administered 2011-09-17 – 2011-09-19 (×5): 2300 [IU]/h via INTRAVENOUS
  Administered 2011-09-19 – 2011-09-20 (×3): 2400 [IU]/h via INTRAVENOUS
  Filled 2011-09-17 (×11): qty 250

## 2011-09-17 NOTE — Clinical Social Work Psychosocial (Signed)
Clinical Social Work Department BRIEF PSYCHOSOCIAL ASSESSMENT 09/17/2011  Patient:  Seth Wilson, Seth Wilson     Account Number:  [id number]     Admit date:  09/15/2011  Clinical Social Worker:  Jodelle Red  Date/Time:  09/17/2011 11:58 AM  Referred by:  CSW  Date Referred:  09/16/2011 Referred for  Other - See comment   Other Referral:   FINANCIAL CONCERNS, SELF PAY   Interview type:  Patient Other interview type:    PSYCHOSOCIAL DATA Living Status:  WIFE Admitted from facility:   Level of care:   Primary support name:  Seth Wilson Primary support relationship to patient:  SPOUSE Degree of support available:   GOOD    CURRENT CONCERNS Current Concerns  Financial Resources   Other Concerns:    SOCIAL WORK ASSESSMENT / PLAN CSW MET WITH PT AT THE BEDSIDE TO ASSESS NEEDS DUE TO POSSIBLE FINANCIAL CONCERNS. PT RECENTLY LOST HIS JOB AND HAS HAD MEDICAID IN THE PAST. HE REPORTS TO LIVE IN [place] WITH HIS WIFE. HE WAS IN TOWN VISITING HIS SON WHEN HE BECAME ILL. HE DENIED ANY CONCERNS AND FELT LIKE HE HAD GOOD SUPPORT TO ASSIST HIM. HE DENIED NEEDING CSW ASSISTANCE CURRENTLY.   Assessment/plan status:  No Further Intervention Required Other assessment/ plan:   Information/referral to community resources:   FINANCIAL COUNSELOR    PATIENT'S/FAMILY'S RESPONSE TO PLAN OF CARE: PT DENIES NEEDING FURTHER ASSISTANCE, BUT IS OPEN TO MEETING WITH FINANCIAL COUNSELOR. PT COPING WELL. CSW SIGNING OFF AS THERE ARE NO OTHER ISSUES PRESENT.    Seth Wilson, [place] 09/17/2011 12:12 PM #[id number]

## 2011-09-17 NOTE — Plan of Care (Signed)
Problem: Food- and Nutrition-Related Knowledge Deficit (NB-1.1) Goal: Nutrition education Formal process to instruct or train a patient/client in a skill or to impart knowledge to help patients/clients voluntarily manage or modify food choices and eating behavior to maintain or improve health.  Outcome: Completed/Met Date Met:  09/17/11 Knowledge deficit resolved with diet education 5/21.  Comments:  Discussed and provided handouts on glycemic index and carbohydrate counting. Per MD request provided healthy diet education to promote weight loss. Patient and wife eager to listen to RD. Patient and wife were without any further nutrition related questions. Verbalized understanding. Expect fair compliance.   RD available for nutrition needs.  Seth Wilson [facility] [phone]

## 2011-09-17 NOTE — Plan of Care (Signed)
Problem: Food- and Nutrition-Related Knowledge Deficit (NB-1.1) Goal: Nutrition education Formal process to instruct or train a patient/client in a skill or to impart knowledge to help patients/clients voluntarily manage or modify food choices and eating behavior to maintain or improve health.  Outcome: Completed/Met Date Met:  09/17/11 Discussed relationship between coumadin and vitamin K with pt and wife. Discussed sources of vitamin K in the diet and the importance of keeping intake of vitamin K consistent in order for the medication to work properly. Pt expressed understanding. Handout of diet information provided.

## 2011-09-17 NOTE — Progress Notes (Signed)
Name: Seth Wilson MRN: [id number] DOB: [birth year]    LOS: 2  Referring Provider:  Denton Lank Reason for Referral:  Hypoxemic Respiratory Failure  PULMONARY / CRITICAL CARE MEDICINE  Brief patient description:   46 y/o obese male with hypoxemic respiratory failure admitted on 5/19 from the [facility].  His chills, elevated WBC and possible LUL infiltrate suggest community acquired pneumonia, but PE cannot be excluded.    Events Since Admission:   Vital Signs: Temp:  [97.7 F (36.5 C)-100.2 F (37.9 C)] 99.3 F (37.4 C) (05/21 0800) Pulse Rate:  [91-107] 107  (05/21 1000) Resp:  [19-30] 30  (05/21 1000) BP: (130-155)/(52-97) 155/86 mmHg (05/21 0800) SpO2:  [93 %-100 %] 93 % (05/21 1000) Weight:  [619 lb 7.9 oz (281 kg)] 619 lb 7.9 oz (281 kg) (05/21 0600)  Physical Examination: General:  Obese male, tachypnic but speaking in full sentences, no accessory muscle use Neuro:  Awake and alert, oriented x4, maew HEENT:  NCAT, PERRL, OP clear Neck:  Supple, no masses Cardiovascular:  Tachy, regular, distant heart sounds, cannot assess JVD Lungs:  Insp crackles in bases bilaterally Abdomen:  Obese, bs+, soft, nontender Musculoskeletal:  Warm, good strength Skin:  Edema in legs bilaterally, no rash or breakdown, chronic venous changes  Principal Problem: Acute on chronic respiratory failure Possible Community acquired pneumonia Likely PE - too heavy for CTA chest or V/Q scan Likely OSA Hypotension - resolved Morbid obesity Acute renal failure Abnormal cardiac markers   ASSESSMENT AND PLAN  PULMONARY  Lab 09/16/11 0444 09/15/11 1735  PHART 7.414 7.378  PCO2ART 32.6* 36.8  PO2ART 70.5* 114.0*  HCO3 20.5 21.1  O2SAT 94.5 97.1    CXR:  Bilateral increased congestion, possible infiltrate LUL   A:  Acute hypoxemic respiratory failure in setting of cough, infiltrate and fever consistent with community acquired pneumonia. However elevated d-dimer, morbid obesity and recent plane  flight worrisome for acute PE.  Also with elevated troponin and BNP which either represents an acute PE or less likely acute coronary ischemia with heart failure.  Overall,  most likely explanation is CAP with RV strain either from PE or perhaps chronic hypoxemia induced PH.  No real option to rule out DVT to due size and lower extremity venous changes/obesity.   5/20 LE Doppler>>> Technically inadequate to R/O DVT 5/20 TTE>>>Left ventricle: There was mild concentric hypertrophy.Systolic function was probably normal. Right ventricle: The cavity size was mildly dilated.  Poor study to due body habitus.      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  Will likely need minimum of 6 months therapy.     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 Elevated TpI and BNP - this would be compatible with large PE.    P:  -cycle enzymes, trop peak on 3rd 2.14-->1.63 -serial EKG -Cont ASA -Cont tele -Will consider Cards eval but it is unlikely  that he could undergo any diagnostic studies -repeat lactic acid trending down -empirically anticoagulate with heparin gtt, transition to warfarin   RENAL  Lab 09/17/11 0323 09/16/11 0625 09/15/11 1747  NA 137 136 138  K 4.5 4.2 --  CL 103 102 100  CO2 23 22 22   BUN 21 19 12   CREATININE 1.68* 1.88* 1.30  CALCIUM 8.6 8.4 8.8  MG  -- -- --  PHOS -- -- --   Intake/Output      05/20 0701 - 05/21 0700 05/21 0701 - 05/22 0700   P.O. 1080 240   I.V. (mL/kg) 4110 (14.6) 533 (1.9)   IV Piggyback 50    Total Intake(mL/kg) 5240 (18.6) 773 (2.8)   Urine (mL/kg/hr) 1650 (0.2) 450   Total Output 1650 450   Net +3590 +323         Foley:  5/19  A:  Oliguric renal failure, likely due to hypotension-resolving P:   -Trend sr cr -Monitor UOP   GASTROINTESTINAL  Lab 09/15/11 1747  AST 21  ALT 15  ALKPHOS 70  BILITOT 0.4  PROT 7.5  ALBUMIN 3.4*    A:   No acute issues Morbid Obesity P:   -carb modified diet -diet education regarding weight loss and coumadin   INFECTIOUS  Lab 09/17/11 0323 09/16/11 1232 09/16/11 0625 09/15/11 2229 09/15/11 1747  WBC 14.0* -- 14.1* -- 19.6*  PROCALCITON -- 1.78 -- 1.64 --   Cultures: 5/19 blood cx >> 5/19 urine str>>neg 5/19 urine leg >>neg  Antibiotics: 5/19 vanc x1 5/19 zosyn x1 5/19 azithro >> 5/20 5/19 ceftriaxone >> 5/21 5/20 Avelox >>   A:  Possible CAP P:   -abx as above -f/u procalcitonin and lactate -cxr difficult to interpret due to patients size, ? L mid airspace opacity    BEST PRACTICE / DISPOSITION - Level of Care:  Floor with tele  - Primary Service:  PCCM - Consultants:   - Code Status:  Full - Diet:  CHO modified - DVT Px:  Hep gtt , started warfarin 5/20 - GI Px:  Not indicated - Skin Integrity:  No breakdown - Social / Family:  Updated at bedside   Canary Brim, NP-C [facility] Pgr: [phone]  09/17/2011, 10:58 AM   Seen on CCM rounds this morning with resident MD or ACNP above.  Pt examined and database reviewed. I agree with above findings, assessment and plan as reflected in the note above.   Billy Fischer, MD;  PCCM service; Mobile [phone]

## 2011-09-17 NOTE — Progress Notes (Signed)
ANTICOAGULATION CONSULT NOTE - Follow Up Consult  Pharmacy Consult for Heparin and Coumadin Indication: pulmonary embolus  No Known Allergies  Patient Measurements: Height: 6\' 2"  (188 cm) Weight: 619 lb 7.9 oz (281 kg) IBW/kg (Calculated) : 82.2  Heparin Dosing Weight: 155kg  Vital Signs: Temp: 99.7 F (37.6 C) (05/21 0200) Temp src: Core (Comment) (05/21 0000) BP: 140/94 mmHg (05/21 0100) Pulse Rate: 94  (05/21 0200)  Labs:  Basename 09/17/11 0323 09/16/11 1425 09/16/11 1420 09/16/11 1231 09/16/11 0625 09/15/11 2229 09/15/11 1747  HGB 10.7* -- -- -- 11.2* -- --  HCT 33.8* -- -- -- 34.9* -- 38.3*  PLT 244 -- -- -- 277 -- 316  APTT -- -- -- -- -- -- --  LABPROT 15.9* 15.3* -- -- -- -- --  INR 1.24 1.18 -- -- -- -- --  HEPARINUNFRC 0.27* -- 0.54 -- 0.54 -- --  CREATININE 1.68* -- -- -- 1.88* -- 1.30  CKTOTAL -- -- -- 282* 276* 255* --  CKMB -- -- -- 9.8* 9.7* 6.2* --  TROPONINI -- -- -- 1.63* 2.14* 1.52* --    Estimated Creatinine Clearance: 127 ml/min (by C-G formula based on Cr of 1.68).   Medications:  Scheduled:     . aspirin EC  325 mg Oral Daily  . cefTRIAXone (ROCEPHIN)  IV  1 g Intravenous QHS  . moxifloxacin  400 mg Oral q1800  . patient's guide to using coumadin book   Does not apply Once  . warfarin  10 mg Oral ONCE-1800  . warfarin   Does not apply Once  . warfarin   Does not apply Once  . Warfarin - Pharmacist Dosing Inpatient   Does not apply q1800  . DISCONTD: albuterol  2.5 mg Nebulization Q4H  . DISCONTD: albuterol  2.5 mg Nebulization Q4H  . DISCONTD: aspirin EC  81 mg Oral Daily  . DISCONTD: azithromycin  500 mg Intravenous Q24H  . DISCONTD: ipratropium  0.5 mg Nebulization Q4H  Infusions:    . sodium chloride 50 mL/hr at 09/15/11 1843  . sodium chloride 150 mL/hr at 09/17/11 0318  . heparin 2,000 Units/hr (09/16/11 2243)     Assessment:  45yo obese M admitted with hypotension and resp failure. Recent air travel.   Started heparin  infusion on 5/19 for suspected PE (D-dimer was elevated & unable to get CT d/t his size).  Day #2/5 minimum warfarin overlap. Will need 5 day overlap and therapeutic INR x 24hrs.  Heparin level slightly subtherapeutic on 2000units/hr. No bleeding reported/documented.  No interruptions reported by RN. Noted drug-drug interaction with warfarin and moxifloxacin (increases INR)  Goal of Therapy:  INR 2-3 Heparin level 0.3-0.7 units/ml Monitor platelets by anticoagulation protocol: Yes   Plan:   Heparin bolus 2000 units IV x1  Increase heparin to 2300 units/hr (23 ml/hr)  Repeat warfarin 10mg  today.  Heparin level in 6 hours.  F/u daily heparin level, CBC, and INR.  Provide Coumadin education.  Consider switching heparin to Lovenox.     Lynann Beaver PharmD, BCPS Pager [phone] 09/17/2011 7:19 AM

## 2011-09-17 NOTE — Progress Notes (Signed)
ANTICOAGULATION CONSULT NOTE - Follow Up Consult  Pharmacy Consult for Heparin Indication: pulmonary embolus  No Known Allergies  Patient Measurements: Height: 6\' 2"  (188 cm) Weight: 619 lb 7.9 oz (281 kg) IBW/kg (Calculated) : 82.2  Heparin Dosing Weight: 155kg  Vital Signs: Temp: 97.9 F (36.6 C) (05/21 1456) Temp src: Oral (05/21 1456) BP: 147/86 mmHg (05/21 1456) Pulse Rate: 105  (05/21 1456)  Labs:  Basename 09/17/11 1545 09/17/11 0323 09/16/11 1425 09/16/11 1420 09/16/11 1231 09/16/11 0625 09/15/11 2229 09/15/11 1747  HGB -- 10.7* -- -- -- 11.2* -- --  HCT -- 33.8* -- -- -- 34.9* -- 38.3*  PLT -- 244 -- -- -- 277 -- 316  APTT -- -- -- -- -- -- -- --  LABPROT -- 15.9* 15.3* -- -- -- -- --  INR -- 1.24 1.18 -- -- -- -- --  HEPARINUNFRC 0.38 0.27* -- 0.54 -- -- -- --  CREATININE -- 1.68* -- -- -- 1.88* -- 1.30  CKTOTAL -- -- -- -- 282* 276* 255* --  CKMB -- -- -- -- 9.8* 9.7* 6.2* --  TROPONINI -- -- -- -- 1.63* 2.14* 1.52* --    Estimated Creatinine Clearance: 127 ml/min (by C-G formula based on Cr of 1.68).   Medications:  Scheduled:     . aspirin EC  325 mg Oral Daily  . heparin  2,000 Units Intravenous Once  . moxifloxacin  400 mg Oral q1800  . patient's guide to using coumadin book   Does not apply Once  . warfarin  10 mg Oral ONCE-1800  . warfarin  10 mg Oral ONCE-1800  . warfarin   Does not apply Once  . Warfarin - Pharmacist Dosing Inpatient   Does not apply q1800  . DISCONTD: cefTRIAXone (ROCEPHIN)  IV  1 g Intravenous QHS  . DISCONTD: warfarin   Does not apply Once  Infusions:    . sodium chloride 50 mL/hr at 09/15/11 1843  . heparin 2,300 Units/hr (09/17/11 1300)  . DISCONTD: sodium chloride 150 mL/hr at 09/17/11 0959  . DISCONTD: heparin 2,000 Units/hr (09/16/11 2243)   Assessment:  46yo obese M admitted with hypotension and resp failure. Recent air travel.   Started heparin infusion on 5/19 for suspected PE (D-dimer was elevated &  unable to get CT d/t his size).  Heparin level in target range on 2300units/hr. No bleeding reported/documented.  Goal of Therapy:  INR 2-3 Heparin level 0.3-0.7 units/ml Monitor platelets by anticoagulation protocol: Yes   Plan:   Cont heparin at 2300 units/hr (23 ml/hr)  F/u heparin level with am labs.  Charolotte Eke, PharmD, pager [phone]. 09/17/2011,5:36 PM.

## 2011-09-18 LAB — CBC
HCT: 32.4 % — ABNORMAL LOW (ref 39.0–52.0)
Hemoglobin: 10.5 g/dL — ABNORMAL LOW (ref 13.0–17.0)
MCH: 25.1 pg — ABNORMAL LOW (ref 26.0–34.0)
MCHC: 32.4 g/dL (ref 30.0–36.0)
MCV: 77.3 fL — ABNORMAL LOW (ref 78.0–100.0)
RBC: 4.19 MIL/uL — ABNORMAL LOW (ref 4.22–5.81)

## 2011-09-18 MED ORDER — WARFARIN SODIUM 7.5 MG PO TABS
15.0000 mg | ORAL_TABLET | Freq: Once | ORAL | Status: AC
  Administered 2011-09-18: 15 mg via ORAL
  Filled 2011-09-18: qty 2

## 2011-09-18 NOTE — Progress Notes (Signed)
Name: Seth Wilson MRN: [id number] DOB: [birth year]    LOS: 3  Referring Provider:  Denton Lank Reason for Referral:  Hypoxemic Respiratory Failure  PULMONARY / CRITICAL CARE MEDICINE  Brief patient description:   46 y/o obese male with hypoxemic respiratory failure admitted on 5/19 from the [facility] by PCCM.  His chills, elevated WBC and possible LUL infiltrate suggest community acquired pneumonia, but PE cannot be excluded.  Events Since Admission:  Subjective/overnight: Feels much better, ambulatory to Nursing station sob on ra  Vital Signs: Temp:  [97.4 F (36.3 C)-98.1 F (36.7 C)] 98.1 F (36.7 C) (05/22 1342) Pulse Rate:  [92-107] 94  (05/22 1342) Resp:  [16-24] 22  (05/22 1342) BP: (131-150)/(84-98) 131/96 mmHg (05/22 1342) SpO2:  [93 %-96 %] 96 % (05/22 1342) FI02  RA    Physical Examination: General:  Obese male no sob at rest  no accessory muscle use Neuro:  Awake and alert, oriented x4, maew HEENT:  NCAT, PERRL, OP clear Neck:  Supple, no masses Cardiovascular: RRR no tachy, distant heart sounds, cannot assess JVD Lungs: Bs decreased in bases bilaterally Abdomen:  Obese, bs+, soft, nontender Musculoskeletal:  Warm, good strength Skin:  Edema in legs bilaterally, no rash or breakdown, chronic venous changes  Principal Problem:  Principal Problem:  *Acute respiratory failure Active Problems:  Community acquired pneumonia  Hypotension  Morbid obesity  Acute renal failure  Elevated troponin  Pulmonary embolism    ASSESSMENT AND PLAN  PULMONARY  Lab 09/16/11 0444 09/15/11 1735  PHART 7.414 7.378  PCO2ART 32.6* 36.8  PO2ART 70.5* 114.0*  HCO3 20.5 21.1  O2SAT 94.5 97.1   5/20 LE Doppler>>> Technically inadequate to R/O DVT 5/20 TTE>>>Left ventricle: There was mild concentric hypertrophy.Systolic function was probably normal. Right ventricle: The cavity size was mildly dilated.  Poor study to due body habitus    A:  Acute hypoxemic respiratory failure  in setting of cough, infiltrate and fever consistent with community acquired pneumonia. However elevated d-dimer, morbid obesity and recent plane flight worrisome for acute PE.  Also with elevated troponin and BNP which either represents an acute PE or less likely acute coronary ischemia with heart failure.  Overall,  most likely explanation is CAP with RV strain either from PE or perhaps chronic hypoxemia induced PH.  No real option to rule out DVT to due size and lower extremity venous changes/obesity.   .      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  Will likely need minimum of 6 months therapy.     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 Elevated TpI and BNP - this would be compatible with large PE.    P:  -cycle enzymes, trop peak on 3rd 2.14-->1.63  -Cont ASA -Cont tele -Hep started 5/19 >  transition to warfarin in progress but need 48 h overlap   RENAL  Lab 09/17/11 0323 09/16/11 0625 09/15/11 1747  NA 137 136 138  K 4.5 4.2 --  CL 103 102 100  CO2 23 22 22   BUN 21 19 12   CREATININE 1.68* 1.88* 1.30  CALCIUM 8.6 8.4 8.8  MG -- -- --  PHOS -- -- --   Intake/Output      05/21 0701 - 05/22 0700 05/22 0701 - 05/23 0700   P.O. 960 480   I.V. (mL/kg) 1547 (5.5)    IV Piggyback     Total Intake(mL/kg) 2507 (8.9) 480 (1.7)    Urine (mL/kg/hr) 1625 (0.2)    Total Output 1625    Net +882 +480        Urine Occurrence  3 x    Foley:  5/19 > out 5/21  A:  Oliguric renal failure, likely due to hypotension-resolving  P:   -Trend sr cr -Monitor UOP   GASTROINTESTINAL  Lab 09/15/11 1747  AST 21  ALT 15  ALKPHOS 70  BILITOT 0.4  PROT 7.5  ALBUMIN 3.4*    A:   No acute issues Morbid Obesity  P:   -carb modified diet -diet education regarding weight loss and coumadin  -Bariatric consult 5/22   INFECTIOUS  Lab 09/18/11 0541 09/17/11 0323 09/16/11 1232 09/16/11 0625 09/15/11 2229 09/15/11 1747  WBC 12.8* 14.0* -- 14.1* -- 19.6*  PROCALCITON -- -- 1.78 -- 1.64 --   Cultures: 5/19 blood cx >> 5/19 urine str>>neg 5/19 urine leg >>neg  Antibiotics: 5/19 vanc x1 5/19 zosyn x1 5/19 azithro >> 5/20 5/19 ceftriaxone >> 5/21 5/20 Avelox >>   A:  Possible CAP P:   -abx as above/ PCT favors low grade pna over PE   -cxr difficult to interpret due to patients size, ? L mid airspace opacity    BEST PRACTICE / DISPOSITION - Level of Care:  Floor with tele  - Primary Service:  PCCM - Consultants: Bariactric consult requested 5/22 - Code Status:  Full - Diet:  CHO modified - DVT Px:  Hep gtt sinc 5/19 , started warfarin 5/20 - GI Px:  Not indicated - Skin Integrity:  No breakdown -   Canary Brim, NP-C [facility] Pgr: [phone]  Sandrea Hughs, MD Pulmonary and Critical Care Medicine [facility] Cell [phone]

## 2011-09-18 NOTE — Progress Notes (Addendum)
ANTICOAGULATION CONSULT NOTE - Follow Up Consult  Pharmacy Consult for Heparin Indication: pulmonary embolus  No Known Allergies  Patient Measurements: Height: 6\' 2"  (188 cm) Weight: 619 lb 7.9 oz (281 kg) IBW/kg (Calculated) : 82.2  Heparin Dosing Weight: 155kg  Vital Signs: Temp: 97.4 F (36.3 C) (05/22 0555) Temp src: Oral (05/22 0555) BP: 131/84 mmHg (05/22 0555) Pulse Rate: 93  (05/22 0555)  Labs:  Basename 09/18/11 0541 09/17/11 1545 09/17/11 0323 09/16/11 1425 09/16/11 1231 09/16/11 0625 09/15/11 2229 09/15/11 1747  HGB 10.5* -- 10.7* -- -- -- -- --  HCT 32.4* -- 33.8* -- -- 34.9* -- --  PLT 265 -- 244 -- -- 277 -- --  APTT -- -- -- -- -- -- -- --  LABPROT 17.2* -- 15.9* 15.3* -- -- -- --  INR 1.38 -- 1.24 1.18 -- -- -- --  HEPARINUNFRC 0.38 0.38 0.27* -- -- -- -- --  CREATININE -- -- 1.68* -- -- 1.88* -- 1.30  CKTOTAL -- -- -- -- 282* 276* 255* --  CKMB -- -- -- -- 9.8* 9.7* 6.2* --  TROPONINI -- -- -- -- 1.63* 2.14* 1.52* --    Estimated Creatinine Clearance: 127 ml/min (by C-G formula based on Cr of 1.68).   Medications:  Scheduled:     . aspirin EC  325 mg Oral Daily  . heparin  2,000 Units Intravenous Once  . moxifloxacin  400 mg Oral q1800  . warfarin  10 mg Oral ONCE-1800  . warfarin   Does not apply Once  . Warfarin - Pharmacist Dosing Inpatient   Does not apply q1800  . DISCONTD: cefTRIAXone (ROCEPHIN)  IV  1 g Intravenous QHS  . DISCONTD: warfarin   Does not apply Once  Infusions:    . sodium chloride 10 mL/hr at 09/17/11 1900  . heparin 2,300 Units/hr (09/18/11 0715)  . DISCONTD: sodium chloride 150 mL/hr at 09/17/11 0959  . DISCONTD: heparin 2,000 Units/hr (09/16/11 2243)   Assessment:  46yo obese M admitted with hypotension and resp failure. Recent air travel.   Started heparin infusion on 5/19 for suspected PE (D-dimer was elevated & unable to get CT d/t his size).  Day #3/5 minimum overlap. Will need 5 day overlap and therapeutic  INR x 2 days.  Heparin level in target range on 2300units/hr.  INR slowly responding after coumadin 10mg  x 2 doses No bleeding reported/documented. Potential drug interaction with Avelox noted.  Goal of Therapy:  INR 2-3 Heparin level 0.3-0.7 units/ml Monitor platelets by anticoagulation protocol: Yes   Plan:   Cont heparin at 2300 units/hr (23 ml/hr)  Increase coumadin 15mg  today  Changing to lovenox would be challenging in this patient due to questionable absorption with extreme obesity and renal insufficiency - would need to monitor anti-Xa levels closely.  F/u heparin level with am labs.  Educated patient regarding coumadin indication, adverse effects, importance of INR monitoring, food & drug interactions. He demonstrated excellent understanding.  Loralee Pacas, PharmD, BCPS Pager: [phone] 09/18/2011 1:13 PM

## 2011-09-19 LAB — CBC
HCT: 33.7 % — ABNORMAL LOW (ref 39.0–52.0)
Hemoglobin: 10.8 g/dL — ABNORMAL LOW (ref 13.0–17.0)
MCH: 24.8 pg — ABNORMAL LOW (ref 26.0–34.0)
MCHC: 32 g/dL (ref 30.0–36.0)

## 2011-09-19 MED ORDER — WARFARIN SODIUM 10 MG PO TABS
10.0000 mg | ORAL_TABLET | Freq: Once | ORAL | Status: AC
  Administered 2011-09-19: 10 mg via ORAL
  Filled 2011-09-19: qty 1

## 2011-09-19 MED ORDER — AMLODIPINE BESYLATE 10 MG PO TABS
10.0000 mg | ORAL_TABLET | Freq: Every day | ORAL | Status: DC
  Administered 2011-09-19 – 2011-09-20 (×2): 10 mg via ORAL
  Filled 2011-09-19 (×2): qty 1

## 2011-09-19 NOTE — Progress Notes (Signed)
Dr Vassie Loll notified about patient having an elevated blood pressure of 163/119. New orders received and initiated. Will continue to monitor.

## 2011-09-19 NOTE — Progress Notes (Signed)
ANTICOAGULATION CONSULT NOTE - Follow Up Consult  Pharmacy Consult for Heparin Indication: pulmonary embolus  No Known Allergies  Patient Measurements: Height: 6\' 2"  (188 cm) Weight: 619 lb 7.9 oz (281 kg) IBW/kg (Calculated) : 82.2  Heparin Dosing Weight: 155kg  Vital Signs: Temp: 98 F (36.7 C) (05/23 0544) Temp src: Oral (05/23 0544) BP: 129/95 mmHg (05/23 0544) Pulse Rate: 65  (05/23 0544)  Labs:  Basename 09/19/11 0520 09/18/11 0541 09/17/11 1545 09/17/11 0323 09/16/11 1231  HGB 10.8* 10.5* -- -- --  HCT 33.7* 32.4* -- 33.8* --  PLT 284 265 -- 244 --  APTT -- -- -- -- --  LABPROT 21.7* 17.2* -- 15.9* --  INR 1.85* 1.38 -- 1.24 --  HEPARINUNFRC 0.29* 0.38 0.38 -- --  CREATININE -- -- -- 1.68* --  CKTOTAL -- -- -- -- 282*  CKMB -- -- -- -- 9.8*  TROPONINI -- -- -- -- 1.63*    Estimated Creatinine Clearance: 127 ml/min (by C-G formula based on Cr of 1.68).   Medications:  Scheduled:     . aspirin EC  325 mg Oral Daily  . moxifloxacin  400 mg Oral q1800  . warfarin  15 mg Oral ONCE-1800  . Warfarin - Pharmacist Dosing Inpatient   Does not apply q1800  Infusions:    . sodium chloride 10 mL/hr at 09/17/11 1900  . heparin 2,300 Units/hr (09/19/11 0523)   Assessment:  45yo obese M admitted with hypotension and resp failure. Recent air travel.   Started heparin infusion on 5/19 for suspected PE (D-dimer was elevated & unable to get CT d/t his size).  Day #4/5 minimum overlap. Will need 5 day overlap and therapeutic INR x 2 days per CHEST Guidelines.  Heparin level slightly subtherapeutic on 2300units/hr.  INR subtherapeutic but responding after coumadin 10, 10, 15mg  No bleeding reported/documented. Potential drug interaction with Avelox noted.  Goal of Therapy:  INR 2-3 Heparin level 0.3-0.7 units/ml Monitor platelets by anticoagulation protocol: Yes   Plan:   Increase heparin slightly to 2400 units/hr  Coumadin 10mg  today  Changing to lovenox  would be challenging in this patient due to questionable absorption with extreme obesity and renal insufficiency - would need to monitor anti-Xa levels closely.  F/u heparin level with am labs.  Educated patient on 5/22 regarding coumadin indication, adverse effects, importance of INR monitoring, food & drug interactions. He demonstrated excellent understanding.  Loralee Pacas, PharmD, BCPS Pager: [phone] 09/19/2011 7:06 AM

## 2011-09-20 LAB — CBC
Hemoglobin: 11.3 g/dL — ABNORMAL LOW (ref 13.0–17.0)
MCH: 24.9 pg — ABNORMAL LOW (ref 26.0–34.0)
MCV: 77.1 fL — ABNORMAL LOW (ref 78.0–100.0)
RBC: 4.54 MIL/uL (ref 4.22–5.81)

## 2011-09-20 MED ORDER — WARFARIN SODIUM 10 MG PO TABS: 10.0000 mg | ORAL_TABLET | Freq: Every day | ORAL | Status: AC

## 2011-09-20 MED ORDER — WARFARIN SODIUM 10 MG PO TABS
10.0000 mg | ORAL_TABLET | Freq: Every day | ORAL | Status: DC
  Administered 2011-09-20: 10 mg via ORAL
  Filled 2011-09-20: qty 1

## 2011-09-20 NOTE — Progress Notes (Signed)
ANTICOAGULATION CONSULT NOTE - Follow Up Consult  Pharmacy Consult for Heparin/Coumadin Indication: pulmonary embolus  No Known Allergies  Patient Measurements: Height: 6\' 2"  (188 cm) Weight: 619 lb 7.9 oz (281 kg) IBW/kg (Calculated) : 82.2  Heparin Dosing Weight: 155kg  Vital Signs: Temp: 97.5 F (36.4 C) (05/24 0559) Temp src: Oral (05/24 0559) BP: 163/94 mmHg (05/24 0607) Pulse Rate: 86  (05/24 0559)  Labs:  Basename 09/20/11 0432 09/19/11 0520 09/18/11 0541  HGB 11.3* 10.8* --  HCT 35.0* 33.7* 32.4*  PLT 313 284 265  APTT -- -- --  LABPROT 24.8* 21.7* 17.2*  INR 2.20* 1.85* 1.38  HEPARINUNFRC 0.30 0.29* 0.38  CREATININE -- -- --  CKTOTAL -- -- --  CKMB -- -- --  TROPONINI -- -- --    Estimated Creatinine Clearance: 127 ml/min (by C-G formula based on Cr of 1.68).   Medications:  Scheduled:     . amLODipine  10 mg Oral Daily  . aspirin EC  325 mg Oral Daily  . moxifloxacin  400 mg Oral q1800  . warfarin  10 mg Oral ONCE-1800  . Warfarin - Pharmacist Dosing Inpatient   Does not apply q1800  Infusions:    . sodium chloride 10 mL/hr at 09/17/11 1900  . heparin 2,400 Units/hr (09/20/11 0203)   Assessment:  45yo obese M admitted with hypotension and resp failure. Recent air travel.   Started heparin infusion on 5/19 for suspected PE (D-dimer was elevated & unable to get CT d/t his size).  Day #5 heparin/coumadin overlap with therapeutic INR x 1 day. CHEST Guidelines recommend overlap x 24hr.  Heparin level therapeutic on 2400units/hr.  INR therapeutic after coumadin 10, 10, 15, 10mg  No bleeding reported/documented. Potential drug interaction with Avelox noted.  Goal of Therapy:  INR 2-3 Heparin level 0.3-0.7 units/ml Monitor platelets by anticoagulation protocol: Yes   Plan:   Continue heparin slightly to 2400 units/hr.  Coumadin 10mg  today. If discharged, recommend 10mg  daily and check INR Mon 5/27.  F/u INR, CBC, heparin level with am  labs.  Educated patient on 5/22 regarding coumadin indication, adverse effects, importance of INR monitoring, food & drug interactions. He demonstrated excellent understanding.  Loralee Pacas, PharmD, BCPS Pager: [phone] 09/20/2011 7:59 AM

## 2011-09-20 NOTE — Discharge Summary (Signed)
Physician Discharge Summary  Patient ID: Dock Baccam MRN: [id number] DOB/AGE: 1965-11-20 46 y.o.  Admit date: 09/15/2011 Discharge date: 09/20/2011    Discharge Diagnoses:  Acute respiratory failure secondary to CAP vs. PE Community acquired pneumonia Hypotension Morbid obesity Acute renal failure Elevated troponin Pulmonary embolism    Brief Summary: Seth Wilson is a 46 y.o. y/o male with a PMH of  46 y/o morbidly obese male with hypertension presented to [facility] on 5/19 in marked respiratory distress, hypoxemia and hypotension. He was started on BIPAP, given IVF, and antibiotics and PCCM was called for admission. He reported that in the week prior to admit he had been feeling more short of breath and had fever, chills, and a cough productive of clear sputum. He flew to [place] on 5/17 for a one day trip (after these symptoms had started) and felt worse after he returned. He noted no new leg pain or chest pain after the flight. He presented to the ED on 5/19 due to increasing dyspnea. After receiving BIPAP, IVF and anti-biotics he was feeling much better.  Secondary to body habitus, he was unable to receive CTA / VQ to rule out PE.  Lower extremity dopplers were severely limited due to morbid obesity.  Areas imaged revealed no obvious DVT (small regions of the thigh, popliteal fossa, calf, and distal ankle).  He was noted to have elevated troponin and BNP - thought likely related to community acquired PNA with RV strain from potential chronic hypoxia but PE could not be ruled out.  He was empirically anticoagulated for PE and Community Acquired PNA.     Consults: Nutrition Dr. Daphine Deutscher -Bariatric Surgery   Significant Diagnostic Studies:  5/20 ECHO>>>Left ventricle: There was mild concentric hypertrophy.Systolic function was probably normal. Right ventricle: The cavity size was mildly dilated. Transthoracic echocardiography.  Study limited due to habitus.       Hospital Course by Discharge  Summary  Acute hypoxemic respiratory failure in setting of cough, infiltrate and fever consistent with community acquired pneumonia. However elevated d-dimer, morbid obesity and recent plane flight worrisome for acute PE. Also with elevated troponin and BNP which either represents an acute PE or less likely acute coronary ischemia with heart failure. Overall, most likely explanation is CAP with RV strain either from PE or perhaps chronic hypoxemia induced PH. No real option to rule out DVT to due size and lower extremity venous changes/obesity.  Required O2 until 5/21 at which time was weaned off without further needs.  Plan: -empirically anticoagulate for PE and should probably remain on prolonged therapy until it can be demonstrated that his risk factors (obesity, chronic RV failure) are sufficiently resolved. Will likely need minimum of 6 months therapy.  -Will follow up with Dr. Sherene Sires in Pulmonary Office as below -Needs outpatient sleep study -continue 10mg  PO coumadin until follow up as below for PT/ INR in PCP office.  Further coumadin dosing per Primary MD.     Elevated Troponin / NSTEMI  Lab  09/16/11 1232  09/16/11 1231  09/16/11 0625  09/15/11 2229  09/15/11 1800  09/15/11 1749   TROPONINI  --  1.63*  2.14*  1.52*  0.58*  --   LATICACIDVEN  2.3*  --  --  --  --  4.9*   PROBNP  --  --  --  --  1144.0*  --   See discussion above.  Troponin peak of 2.14.       Hypotension Likely due to PE superimposed on chronic RV  failure.  Elevated Troponin and BNP  P:  -BP resolved.  OK to continue home Norvasc and Losartan as outpatient.  Follow up with PCP as below.     Oliguric renal failure, likely due to hypotension P:  -resolved, with normal UOP   No acute issues  Morbid Obesity  P:  -carb modified diet  -diet education regarding weight loss and coumadin  -Bariatric consult with Dr. Daphine Deutscher.  Patient given information and instructed to call Dr. Daphine Deutscher next week for follow up.      Possible CAP  Lab  09/18/11 0541  09/17/11 0323  09/16/11 1232  09/16/11 0625  09/15/11 2229  09/15/11 1747   WBC  12.8*  14.0*  --  14.1*  --  19.6*   PROCALCITON  --  --  1.78  --  1.64  --    Cultures:  5/19 UC>>>neg 5/19 urine str>>neg  5/19 urine leg >>neg   Antibiotics:  5/19 vanc x1  5/19 zosyn x1  5/19 azithro >> 5/20  5/19 ceftriaxone >> 5/21  5/20 Avelox >>524  P:  -patient completed ABX while inpatient.  Normal fever curve / leukocytosis.      Discharge Exam: General: Obese male no sob at rest no accessory muscle use  Neuro: Awake and alert, oriented x4, maew  HEENT: NCAT, PERRL, OP clear  Neck: Supple, no masses  Cardiovascular: RRR no tachy, distant heart sounds, cannot assess JVD  Lungs: Bs decreased in bases bilaterally  Abdomen: Obese, bs+, soft, nontender  Musculoskeletal: Warm, good strength  Skin: Edema in legs bilaterally, no rash or breakdown, chronic venous changes   Discharge Labs  BMET  Lab 09/17/11 0323 09/16/11 0625 09/15/11 1747  NA 137 136 138  K 4.5 4.2 --  CL 103 102 100  CO2 23 22 22   GLUCOSE 105* 137* 175*  BUN 21 19 12   CREATININE 1.68* 1.88* 1.30  CALCIUM 8.6 8.4 8.8  MG -- -- --  PHOS -- -- --     CBC   Lab 09/20/11 0432 09/19/11 0520 09/18/11 0541  HGB 11.3* 10.8* 10.5*  HCT 35.0* 33.7* 32.4*  WBC 9.9 10.2 12.8*  PLT 313 284 265   Anti-Coagulation  Lab 09/20/11 0432 09/19/11 0520 09/18/11 0541 09/17/11 0323 09/16/11 1425  INR 2.20* 1.85* 1.38 1.24 1.18      Discharge Orders    Future Appointments: Provider: Department: Dept Phone: Center:   10/21/2011 10:15 AM Nyoka Cowden, MD [facility] [phone] None     Future Orders Please Complete By Expires   Diet - low sodium heart healthy      Increase activity slowly      Call MD for:  difficulty breathing, headache or visual disturbances      Call MD for:  persistant dizziness or light-headedness      Discharge instructions      Comments:    If you notice bleeding over the weekend (gums, nose, stool etc), call [facility] to reach MD at [phone]       Follow-up Information    Follow up with Reine Just, PA on 09/24/2011. (Appt at 9:30 am with PT/INR)    Contact information:   [phone]      Follow up with Sandrea Hughs, MD on 10/21/2011. (Appt at 10:15 )    Contact information:   [address] [address] [place] [address] [place] [address] [phone]  Also Continue Losartan 100mg  PO QD (pre-hospital Medication / not on med rec) Medication List  As of 09/20/2011  1:48 PM   START taking these medications         warfarin 10 MG tablet   Commonly known as: COUMADIN   Take 1 tablet (10 mg total) by mouth daily at 6 PM.         CONTINUE taking these medications         ALKA-SELTZER PLUS COLD 2-7.8-325 MG Tbef   Generic drug: Chlorphen-Phenyleph-ASA      amLODipine 10 MG tablet   Commonly known as: NORVASC          Where to get your medications    These are the prescriptions that you need to pick up.   You may get these medications from any pharmacy.         warfarin 10 MG tablet             Disposition: Discharge to Home.    Discharged Condition: Evo Aderman has met maximum benefit of inpatient care and is medically stable and cleared for discharge.  Patient is pending follow up as above.   Able to ambulate without assistance.     Time spent on disposition:  Greater than 35 minutes.   Signed: Canary Brim, NP-C [facility] Pgr: [phone]  Pt independently  seen and examined and available cxr's / labs reviewed and I agree with above findings/ imp/ plan.  Patient realizes the limits of our diagnostic methods due to his body habitus and agrees with assuming the risk of empiric rx for either pe or high risk for future dvt/pe in this setting.  Sandrea Hughs, MD Pulmonary and Critical Care Medicine [facility] Cell  [phone]

## 2011-09-20 NOTE — Progress Notes (Signed)
Late entry: Pt seen on rounds doing fine, reviewed hx whichi is more supportive now for cap, not pe, but agree needs longterm coumadin and he understands risk/benefits  Ok to plan discharge therefore for 5/24 s full 2 days overlap  Sandrea Hughs, MD Pulmonary and Critical Care Medicine [facility] Cell [phone]

## 2011-10-21 ENCOUNTER — Inpatient Hospital Stay: Payer: Self-pay | Admitting: Internal Medicine

## 2011-11-22 ENCOUNTER — Encounter: Payer: Self-pay | Admitting: Internal Medicine

## 2011-11-22 NOTE — Progress Notes (Signed)
 This encounter was created in error - please disregard.

## 2015-11-22 ENCOUNTER — Other Ambulatory Visit (HOSPITAL_COMMUNITY): Payer: Self-pay | Admitting: General Surgery

## 2016-01-11 ENCOUNTER — Ambulatory Visit: Payer: Medicaid Other | Admitting: Dietician

## 2016-01-18 ENCOUNTER — Ambulatory Visit: Payer: Medicaid Other | Admitting: Skilled Nursing Facility1

## 2016-01-22 ENCOUNTER — Other Ambulatory Visit (HOSPITAL_COMMUNITY): Payer: Self-pay | Admitting: General Surgery

## 2016-01-30 ENCOUNTER — Encounter: Payer: BC Managed Care – PPO | Attending: General Surgery | Admitting: Dietician

## 2016-01-30 DIAGNOSIS — Z713 Dietary counseling and surveillance: Secondary | ICD-10-CM | POA: Insufficient documentation

## 2016-01-30 NOTE — Progress Notes (Signed)
  Pre-Op Assessment Visit:  Pre-Operative Sleeve Gastrectomy Surgery  Medical Nutrition Therapy:  Appt start time: 0955   End time:  1040.  Patient was seen on 01/30/2016 for Pre-Operative Nutrition Assessment. Assessment and [facility] coordinator on 01/30/2016.   Preferred Learning Style:   No preference indicated   Learning Readiness:   Ready  Handouts given during visit include:  Pre-Op Goals Bariatric Surgery Protein Shakes   During the appointment today the following Pre-Op Goals were reviewed with the patient: Maintain or lose weight as instructed by your surgeon Make healthy food choices Begin to limit portion sizes Limited concentrated sugars and fried foods Keep fat/sugar in the single digits per serving on   food labels Practice CHEWING your food  (aim for 30 chews per bite or until applesauce consistency) Practice not drinking 15 minutes before, during, and 30 minutes after each meal/snack Avoid all carbonated beverages  Avoid/limit caffeinated beverages  Avoid all sugar-sweetened beverages Consume 3 meals per day; eat every 3-5 hours Make a list of non-food related activities Aim for 64-100 ounces of FLUID daily  Aim for at least 60-80 grams of PROTEIN daily Look for a liquid protein source that contain ?15 g protein and ?5 g carbohydrate  (ex: shakes, drinks, shots)  Patient-Centered Goals: Would like to be able to sit in a small seat in the [place] or sit in a middle seat on a airplane or get on a rollercoaster.  10 confidence/10 importance scale  Demonstrated degree of understanding via:  Teach Back  Teaching Method Utilized:  Visual Auditory Hands on  Barriers to learning/adherence to lifestyle change: none  [facility] to [facility] the [facility] to enroll in Pre-Op and Post-Op Nutrition Education when surgery date is scheduled.

## 2016-01-30 NOTE — Patient Instructions (Signed)
Follow Pre-Op Goals Try Protein Shakes Call [facility] at [phone] when surgery is scheduled to enroll in Pre-Op Class  Things to remember:  Please always be honest with us. We want to support you!  If you have any questions or concerns in between appointments, please call or email Liz, Leslie, or Laurie.  The diet after surgery will be high protein and low in carbohydrate.  Vitamins and calcium need to be taken for the rest of your life.  Feel free to include support people in any classes or appointments.   Supplement recommendations:  Before Surgery   1 Complete Multivitamin with Iron  3000 IU Vitamin D3  After Surgery   2 Chewable Multivitamins  **Best Choice - Bariatric Advantage Advanced Multi EA      3 Chewable Calcium (500 mg each, total 1200-1500 mg per day)  **Best Choice - Celebrate, Bariatric Advantage, or Wellesse  Other Options:    2 Flinstones Complete + up to 100 mg Thiamin + 2000-3000 IU Vitamin D3 + 350-500 mcg Vitamin B12 + 30-45 mg Iron (with history of deficiency)  2 Celebrate MultiComplete with 18 mg Iron (this provides 6000 IU of  Vitamin D3)  4 Celebrate Essential Multi 2 in 1 (has calcium) + 18-60 mg separate  iron  Vitamins and Calcium are available at:   [facility]   [address], [place], [place] [address]   [email or web address]  [email or web address]  www.[email or web address]   

## 2016-02-14 ENCOUNTER — Ambulatory Visit: Payer: Medicaid Other | Admitting: Dietician

## 2016-02-21 ENCOUNTER — Ambulatory Visit (HOSPITAL_COMMUNITY)
Admission: RE | Admit: 2016-02-21 | Discharge: 2016-02-21 | Disposition: A | Discharge status: Home / Self Care | Payer: Medicaid Other | Source: Ambulatory Visit | Attending: General Surgery | Admitting: General Surgery

## 2016-02-21 ENCOUNTER — Ambulatory Visit: Payer: Medicaid Other | Admitting: Dietician

## 2016-02-21 ENCOUNTER — Other Ambulatory Visit: Payer: Self-pay

## 2016-02-21 DIAGNOSIS — Z01810 Encounter for preprocedural cardiovascular examination: Secondary | ICD-10-CM | POA: Insufficient documentation

## 2016-02-26 ENCOUNTER — Encounter: Payer: BC Managed Care – PPO | Admitting: Dietician

## 2016-02-26 DIAGNOSIS — Z713 Dietary counseling and surveillance: Secondary | ICD-10-CM | POA: Diagnosis not present

## 2016-02-26 NOTE — Progress Notes (Signed)
6 Months Supervised Weight Loss Visit:   Pre-Operative Sleeve Gastrectomy Surgery  Medical Nutrition Therapy:  Appt start time: 0845 end time:  0900.  Primary concerns today: Supervised Weight Loss Visit. Unable to weight today since scale is out of batteries. Emeline Ginsndres is planning to weight at [facility] today. Has been watching calories, carbs, and sugar intake. Having some Dannon Light and Fit yogurt. Feels like he is getting around better than before. Has tried [employer] which he likes. Drinking mostly water. Getting better about not skipping meals. Having beef jerky for snacks.   Has been doing boot camp 2-3 x week and swimming. Started working on chewing well but hasn't started on not drinking during meals.   Preferred Learning Style:   No preference indicated   Learning Readiness:   Ready   Progress Towards Goal(s):  In progress.  Handouts given during visit include:  none   Nutritional Diagnosis:  [place]-3.3 Obesity related to past poor dietary habits and physical inactivity as evidenced by patient attending supervised weight loss for insurance approval of bariatric surgery.    Intervention:  Nutrition counseling provided. Plan: Start working on not drinking during meals (wait 30 minutes after meals). Continue working on chewing well. Work on eating 3 meals per day and protein snacks (avoid skipping meals).  Teaching Method Utilized:  Visual Auditory Hands on  Barriers to learning/adherence to lifestyle change: none  Demonstrated degree of understanding via:  Teach Back   Monitoring/Evaluation:  Dietary intake, exercise, and body weight. Follow up in 1 months for 6 month supervised weight loss visit.

## 2016-02-26 NOTE — Patient Instructions (Signed)
Start working on not drinking during meals (wait 30 minutes after meals). Continue working on chewing well. Work on eating 3 meals per day and protein snacks (avoid skipping meals).

## 2016-03-18 ENCOUNTER — Encounter: Payer: Self-pay | Admitting: Dietician

## 2016-03-18 ENCOUNTER — Encounter: Payer: BC Managed Care – PPO | Attending: General Surgery | Admitting: Skilled Nursing Facility1

## 2016-03-18 DIAGNOSIS — Z713 Dietary counseling and surveillance: Secondary | ICD-10-CM | POA: Diagnosis not present

## 2016-03-18 NOTE — Progress Notes (Signed)
6 Months Supervised Weight Loss Visit:   Pre-Operative Sleeve Gastrectomy Surgery  Medical Nutrition Therapy:  Appt start time: 0845 end time:  0900.  Primary concerns today: Supervised Weight Loss Visit. Pts Goal to be 575 pounds. Still working on not drinking with meals, been chewing to point of stop eating, avoiding hard meats like steak. Pt states he is skipping meals, loves premeir shakes, eating the oatmeal from [employer]cdonalds without syrup. Been cramping more so trying to drink more water, feels like hes retaining water will talk to his doctor aboutt hat, will stay away from the carbohydrates for thanksgiving,   Scale unable to register wt. Preferred Learning Style:   No preference indicated   Learning Readiness:   Ready   Progress Towards Goal(s):  In progress.  Handouts given during visit include:  none   Nutritional Diagnosis:  [place]-3.3 Obesity related to past poor dietary habits and physical inactivity as evidenced by patient attending supervised weight loss for insurance approval of bariatric surgery.    Coach G. More cardio-5 days a week for 30 and then an hour for a total of 1.5 hours      Intervention:  Nutrition counseling provided. Plan: Start working on not drinking during meals (wait 30 minutes after meals). Continue working on chewing well. Work on eating 3 meals per day and protein snacks (avoid skipping meals).  Teaching Method Utilized:  Visual Auditory Hands on  Barriers to learning/adherence to lifestyle change: none  Demonstrated degree of understanding via:  Teach Back   Monitoring/Evaluation:  Dietary intake, exercise, and body weight. Follow up in 1 months for 6 month supervised weight loss visit.

## 2016-04-26 ENCOUNTER — Encounter: Payer: BC Managed Care – PPO | Attending: General Surgery | Admitting: Dietician

## 2016-04-26 ENCOUNTER — Encounter: Payer: Self-pay | Admitting: Dietician

## 2016-04-26 DIAGNOSIS — Z713 Dietary counseling and surveillance: Secondary | ICD-10-CM | POA: Insufficient documentation

## 2016-04-26 NOTE — Progress Notes (Signed)
6 Months Supervised Weight Loss Visit:   Pre-Operative Sleeve Gastrectomy Surgery  Medical Nutrition Therapy:  Appt start time: 1110 end time: 1125   Primary concerns today: Supervised Weight Loss Visit. Pts Goal to be 575 pounds. Scale is unable to register patient's weight. Patient states that he has been losing weight. Patient reports most recent weight 609 lbs down from 627 lbs. Has been sick recently, has not been eating much and drinking more juice. Has been avoiding eating late at night. Has been practicing chewing thoroughly and finds this tiresome. Notices when he eats greasy foods he gets heartburn; he did not experience this previously.   Preferred Learning Style:   No preference indicated   Learning Readiness:   Ready  Progress Towards Goal(s):  In progress.  Handouts given during visit include:  none   Nutritional Diagnosis:  [place]-3.3 Obesity related to past poor dietary habits and physical inactivity as evidenced by patient attending supervised weight loss for insurance approval of bariatric surgery.    Coach G. More cardio-5 days a week for 30 and then an hour for a total of 1.5 hours      Intervention:  Nutrition counseling provided. Plan: Start working on not drinking during meals (wait 30 minutes after meals). Continue working on chewing well. Work on eating 3 meals per day and protein snacks (avoid skipping meals).  Teaching Method Utilized:  Visual Auditory Hands on  Barriers to learning/adherence to lifestyle change: none  Demonstrated degree of understanding via:  Teach Back   Monitoring/Evaluation:  Dietary intake, exercise, and body weight. Follow up in 1 months for 6 month supervised weight loss visit.

## 2016-04-26 NOTE — Patient Instructions (Signed)
Start working on not drinking during meals (wait 30 minutes after meals). Continue working on chewing well. Work on eating 3 meals per day and protein snacks (avoid skipping meals).   

## 2016-05-22 ENCOUNTER — Encounter: Payer: Self-pay | Admitting: Dietician

## 2016-05-22 ENCOUNTER — Encounter: Payer: BC Managed Care – PPO | Attending: General Surgery | Admitting: Dietician

## 2016-05-22 DIAGNOSIS — Z713 Dietary counseling and surveillance: Secondary | ICD-10-CM | POA: Insufficient documentation

## 2016-05-22 NOTE — Progress Notes (Signed)
6 Months Supervised Weight Loss Visit:   Pre-Operative Sleeve Gastrectomy Surgery  Medical Nutrition Therapy:  Appt start time: 200 end time: 215  Primary concerns today: Supervised Weight Loss Visit. Pts Goal to be 575 pounds. Scale is unable to register patient's weight.  Seth Wilson states he hurt his knee on the treadmill today but continues to exercise about 4 days a week. States he has been practicing not drinking with meals. Notices that he eats less when he eats slowly and chews well. Feeling ready for surgery; states that he has a "better grip" on the recommendations. Would like to attend bariatric support group.   Preferred Learning Style:   No preference indicated   Learning Readiness:   Ready  Progress Towards Goal(s):  In progress.  Handouts given during visit include:  none   Nutritional Diagnosis:  [place]-3.3 Obesity related to past poor dietary habits and physical inactivity as evidenced by patient attending supervised weight loss for insurance approval of bariatric surgery.    Coach G. More cardio-4 days a week for 30 and then an hour for a total of 1.5 hours      Intervention:  Nutrition counseling provided. Plan: Start working on not drinking during meals (wait 30 minutes after meals). Continue working on chewing well. Work on eating 3 meals per day and protein snacks (avoid skipping meals).  Teaching Method Utilized:  Visual Auditory Hands on  Barriers to learning/adherence to lifestyle change: none  Demonstrated degree of understanding via:  Teach Back   Monitoring/Evaluation:  Dietary intake, exercise, and body weight. Follow up in 1 months for 6 month supervised weight loss visit.

## 2016-05-22 NOTE — Patient Instructions (Addendum)
Start working on not drinking during meals (wait 30 minutes after meals). Continue working on chewing well. Work on eating 3 meals per day and protein snacks (avoid skipping meals).  Bariatric Support Group: 3rd Thursday of the month from 6-7 pm [facility] Class[facility]

## 2016-06-19 ENCOUNTER — Encounter: Payer: BC Managed Care – PPO | Attending: General Surgery | Admitting: Skilled Nursing Facility1

## 2016-06-19 DIAGNOSIS — Z713 Dietary counseling and surveillance: Secondary | ICD-10-CM | POA: Insufficient documentation

## 2016-06-19 NOTE — Progress Notes (Signed)
6 Months Supervised Weight Loss Visit:   Pre-Operative Sleeve Gastrectomy Surgery  Medical Nutrition Therapy:  Appt start time: 200 end time: 215  Primary concerns today: Supervised Weight Loss Visit. Pts Goal to be 575 pounds. Scale is unable to register patient's weight.  Pt states he has been Working on chewing, portions, weaning off sugars and has one more appointment, [facility] get [facility] due to [facility] not having the proper equipment to record his weight.   Preferred Learning Style:   No preference indicated   Learning Readiness:   Ready  Progress Towards Goal(s):  In progress.  Handouts given during visit include:  none   Nutritional Diagnosis:  [place]-3.3 Obesity related to past poor dietary habits and physical inactivity as evidenced by patient attending supervised weight loss for insurance approval of bariatric surgery.    Coach G. More cardio-4 days a week for 30 and then an hour for a total of 1.5 hours      Intervention:  Nutrition counseling provided. Plan: Keep working on not drinking during meals (wait 30 minutes after meals). Continue working on chewing well. Work on eating 3 meals per day and protein snacks (avoid skipping meals).  Teaching Method Utilized:  Visual Auditory Hands on  Barriers to learning/adherence to lifestyle change: none  Demonstrated degree of understanding via:  Teach Back   Monitoring/Evaluation:  Dietary intake, exercise, and body weight. Follow up in 1 months for 6 month supervised weight loss visit.

## 2016-07-15 ENCOUNTER — Ambulatory Visit: Payer: BC Managed Care – PPO | Admitting: Skilled Nursing Facility1

## 2016-07-25 ENCOUNTER — Encounter: Payer: BC Managed Care – PPO | Attending: General Surgery | Admitting: Skilled Nursing Facility1

## 2016-07-25 ENCOUNTER — Encounter: Payer: Self-pay | Admitting: Skilled Nursing Facility1

## 2016-07-25 DIAGNOSIS — Z713 Dietary counseling and surveillance: Secondary | ICD-10-CM | POA: Diagnosis not present

## 2016-07-25 DIAGNOSIS — E669 Obesity, unspecified: Secondary | ICD-10-CM

## 2016-07-25 NOTE — Progress Notes (Signed)
6 Months Supervised Weight Loss Visit:   Pre-Operative Sleeve Gastrectomy Surgery  Medical Nutrition Therapy: 07/25/2016  Appt start time: 12:00 end time: 12:15  Primary concerns today: Supervised Weight Loss Visit. Pts Goal to be 575 pounds. Scale is unable to register patient's weight. Pt was advised to [facility] to get weighed due to [facility] not having the proper equipment to record his weight.   Pt states that he's heading to [facility] to have weight taken after this appt. Pt states that he omits red meat. He eats fish so that he doesn't have to chew a lot. Pt states that he limits his amount of sugar intake. Pt states that he has identified some food anxiety 3 weeks ago related to eating throughout the day with 3 meals and 2 snacks. 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  24 hr recall: Breakfast: higher protein oatmeal, orange, boiled egg  Snack: none Lunch: sandwich Snack: none Dinner: Pork chops Beverages: juice diluted half and half with water  Preferred Learning Style:   No preference indicated   Learning Readiness:   Ready  Progress Towards Goal(s):  In progress.  Handouts given during visit include:  none   Nutritional Diagnosis:  -3.3 Obesity related to past poor dietary habits and physical inactivity as evidenced by patient attending supervised weight loss for insurance approval of bariatric surgery.     Intervention:  Nutrition counseling provided. Plan: Work on eating 3 meals per day and snacks (avoid skipping meals).  Teaching Method Utilized:  Visual Auditory Hands on  Barriers to learning/adherence to lifestyle change: none  Demonstrated degree of understanding via:  Teach Back   Monitoring/Evaluation:  Dietary intake, exercise, and body weight. Follow up in 1 months for 6 month supervised weight loss visit.

## 2016-08-13 ENCOUNTER — Institutional Professional Consult (permissible substitution): Payer: BC Managed Care – PPO | Admitting: Neurology

## 2016-08-21 ENCOUNTER — Encounter: Payer: Self-pay | Admitting: Skilled Nursing Facility1

## 2016-08-21 ENCOUNTER — Encounter: Payer: BC Managed Care – PPO | Attending: General Surgery | Admitting: Skilled Nursing Facility1

## 2016-08-21 DIAGNOSIS — Z713 Dietary counseling and surveillance: Secondary | ICD-10-CM | POA: Insufficient documentation

## 2016-08-21 DIAGNOSIS — E669 Obesity, unspecified: Secondary | ICD-10-CM

## 2016-08-21 NOTE — Progress Notes (Signed)
6 Months Supervised Weight Loss Visit:   Pre-Operative Sleeve Gastrectomy Surgery  Medical Nutrition Therapy: 07/25/2016  Appt start time: 12:00 end time: 12:15  Primary concerns today: Supervised Weight Loss Visit. Pts Goal to be 575 pounds. Scale is unable to register patient's weight. Pt was advised to go to [facility] to get weighed due to [facility] not having the proper equipment to record his weight.   Pt states his weight last month was 601 pounds. Pt states he Will go again today to get his weight taken. Pt states he feels like he is getting there but still is a struggle referring to eating throughout the day and having that Mental awareness.Pt states he feels like is packing on weight by eating more often throughout the day.Pt states he will have his Sleep eval in may. Pt states he will have the surgery Maybe in 2-3 months. Pt states he loves the greek light and fit yogurt.Pt states his family is on board with healthy eating and going to the gym with him. Pt states he does not have an issue chewing because he avoids foods that take more to chew. 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 Pt states he might need one more supervised wt loss appointment.    24 hr recall: Breakfast: higher protein oatmeal, orange, boiled egg  Snack: none Lunch: sandwich Snack: none Dinner: Pork chops Beverages: juice diluted half and half with water, 2-3 protein shakes in a  Day   Preferred Learning Style:   No preference indicated   Learning Readiness:   Ready  Progress Towards Goal(s):  In progress.  Handouts given during visit include:  none   Nutritional Diagnosis:  [place]-3.3 Obesity related to past poor dietary habits and physical inactivity as evidenced by patient attending supervised weight loss for insurance approval of bariatric surgery.     Intervention:  Nutrition counseling provided. Plan: Work on eating 3 meals per day and snacks (avoid skipping  meals).  Teaching Method Utilized:  Visual Auditory Hands on  Barriers to learning/adherence to lifestyle change: none  Demonstrated degree of understanding via:  Teach Back   Monitoring/Evaluation:  Dietary intake, exercise, and body weight. Follow up in 1 months for 6 month supervised weight loss visit.

## 2016-09-03 ENCOUNTER — Encounter: Payer: Self-pay | Admitting: Neurology

## 2016-09-03 ENCOUNTER — Encounter (INDEPENDENT_AMBULATORY_CARE_PROVIDER_SITE_OTHER): Payer: Self-pay

## 2016-09-03 ENCOUNTER — Ambulatory Visit (INDEPENDENT_AMBULATORY_CARE_PROVIDER_SITE_OTHER): Payer: BC Managed Care – PPO | Admitting: Neurology

## 2016-09-03 VITALS — BP 118/80 | HR 82 | Resp 24 | Ht 73.0 in | Wt >= 6400 oz

## 2016-09-03 DIAGNOSIS — R400 Somnolence: Secondary | ICD-10-CM | POA: Diagnosis not present

## 2016-09-03 DIAGNOSIS — J45909 Unspecified asthma, uncomplicated: Secondary | ICD-10-CM

## 2016-09-03 DIAGNOSIS — G478 Other sleep disorders: Secondary | ICD-10-CM | POA: Diagnosis not present

## 2016-09-03 DIAGNOSIS — Z6841 Body Mass Index (BMI) 40.0 and over, adult: Secondary | ICD-10-CM

## 2016-09-03 DIAGNOSIS — R351 Nocturia: Secondary | ICD-10-CM | POA: Diagnosis not present

## 2016-09-03 DIAGNOSIS — R0683 Snoring: Secondary | ICD-10-CM | POA: Diagnosis not present

## 2016-09-03 DIAGNOSIS — Z86718 Personal history of other venous thrombosis and embolism: Secondary | ICD-10-CM | POA: Diagnosis not present

## 2016-09-03 NOTE — Patient Instructions (Signed)

## 2016-09-03 NOTE — Progress Notes (Signed)
Subjective:    Patient ID: Seth Wilson is a 51 y.o. male.  HPI     Huston Foley, MD, PhD [facility] [address], [address] [address] [place], [place] [address]  Dear Dr. Sheliah Hatch,   I saw your patient, Seth Wilson, upon your kind request in my neurologic clinic today for initial consultation of his sleep disorder, in particular, concern for underlying obstructive sleep apnea. The patient is unaccompanied today. As you know, Mr. Heiland is a 76 -year-old right-handed gentleman with an underlying medical history of hypertension, asthma, lower extremity edema, asthma, history of blood clots, and morbid obesity with a BMI of over 80, who reports snoring and excessive daytime somnolence. I reviewed your office note from 11/20/2015, which you kindly included. He's being evaluated for bariatric surgery, in particular sleeve gastrectomy. His Epworth sleepiness score is 8 out of 24, fatigue score is 40 out of 63. He lives at home with his wife, has 3 children. He is a nonsmoker, he drinks alcohol rarely, caffeine rarely. He is a Psychologist, forensic. He is not aware of any family history of OSA. He had a tonsillectomy as a teenager. His bedtime is around midnight, wakeup time around 5 AM. He has woken up with a sense of choking. He has nocturia about twice per average night. His wife has reported his snoring to him. He also has a history of sleep talking, no sleepwalking or other parasomnias. He denies restless leg symptoms. He has chronic lower extremity swelling. He has a history of asthma which is typically well controlled.  His Past Medical History Is Significant For: Past Medical History:  Diagnosis Date  . Asthma   . Hypertension   . Obesity, Class III, BMI 40-49.9 (morbid obesity) (HCC)     His Past Surgical History Is Significant For: Past Surgical History:  Procedure Laterality Date  . MOUTH SURGERY    . TONSILLECTOMY      His Family History Is Significant  For: Family History  Problem Relation Age of Onset  . Hypertension Mother     His Social History Is Significant For: Social History   Social History  . Marital status: Married    Spouse name: N/A  . Number of children: 3  . Years of education: College    Occupational History  . Teacher     Social History Main Topics  . Smoking status: Never Smoker  . Smokeless tobacco: Never Used  . Alcohol use Yes     Comment: Rare  . Drug use: No  . Sexual activity: Not Asked   Other Topics Concern  . None   Social History Narrative   Rare caffeine use     His Allergies Are:  No Known Allergies:   His Current Medications Are:  Outpatient Encounter Prescriptions as of 09/03/2016  Medication Sig  . amLODipine (NORVASC) 10 MG tablet Take 10 mg by mouth daily.  . Chlorphen-Phenyleph-ASA (ALKA-SELTZER PLUS COLD) 2-7.8-325 MG TBEF Take 2 tablets by mouth every 6 (six) hours as needed. For cold  . losartan (COZAAR) 100 MG tablet Take 100 mg by mouth daily.  Marland Kitchen warfarin (COUMADIN) 10 MG tablet Take 1 tablet (10 mg total) by mouth daily at 6 PM.   No facility-administered encounter medications on file as of 09/03/2016.   :  Review of Systems:  Out of a complete 14 point review of systems, all are reviewed and negative with the exception of these symptoms as listed below: Review of Systems  Neurological:       Wakes up in the night to go to the bathroom, snores, once woke up choking, sometimes takes naps.    Epworth Sleepiness Scale 0= would never doze 1= slight chance of dozing 2= moderate chance of dozing 3= high chance of dozing  Sitting and reading:1 Watching TV:1 Sitting inactive in a public place (ex. Theater or meeting):0 As a passenger in a car for an hour without a break:2 Lying down to rest in the afternoon:2 Sitting and talking to someone:0 Sitting quietly after lunch (no alcohol):2 In a car, while stopped in traffic:0 Total:8  Objective:  Neurologic  Exam  Physical Exam Physical Examination:   Vitals:   09/03/16 0905  BP: 118/80  Pulse: 82  Resp: (!) 24    General Examination: The patient is a very pleasant 51 y.o. male in no acute distress. He is adequately groomed.  HEENT: Normocephalic, atraumatic, pupils are equal, round and reactive to light and accommodation. Extraocular tracking is good without limitation to gaze excursion or nystagmus noted. Normal smooth pursuit is noted. Hearing is grossly intact. Face is symmetric with normal facial animation and normal facial sensation. Speech is clear with no dysarthria noted. There is no hypophonia. There is no lip, neck/head, jaw or voice tremor. Neck is supple with full range of passive and active motion. There are no carotid bruits on auscultation. Oropharynx exam reveals: mild mouth dryness, adequate dental hygiene and marked airway crowding, due to small airway entry, large uvula, larger appearing tongue. Tonsils are absent, Mallampati is class III. Neck circumference is 21 inches. He has a mild underbite.  Chest: Clear to auscultation without wheezing, rhonchi or crackles noted.  Heart: S1+S2+0, regular and normal without murmurs, rubs or gallops noted.   Abdomen: Soft, non-tender and non-distended with normal bowel sounds appreciated on auscultation.  Extremities: There is 2+ pitting edema in the distal lower extremities bilaterally, with chronic stasis-like changes noted in the lower extremities.  Skin: Warm and dry with trophic changes noted in the LEs.  Musculoskeletal: exam reveals no obvious joint deformities, tenderness or joint swelling or erythema, but reports right knee pain.   Neurologically:  Mental status: The patient is awake, alert and oriented in all 4 spheres. His immediate and remote memory, attention, language skills and fund of knowledge are appropriate. There is no evidence of aphasia, agnosia, apraxia or anomia. Speech is clear with normal prosody and  enunciation. Thought process is linear. Mood is normal and affect is normal.  Cranial nerves II - XII are as described above under HEENT exam. In addition: shoulder shrug is normal with equal shoulder height noted. Motor exam: Normal bulk, strength and tone is noted. There is no drift, tremor or rebound. Romberg is not tested for safety. Fine motor skills and coordination: grossly intact.  Cerebellar testing: No dysmetria or intention tremor.  Sensory exam: intact to light touch.  Gait, station and balance: He stands with significant difficulty and stands wide-based. He walks slowly. Tandem walk is not tested for safety.           Assessment and Plan:  In summary, Bing Neighborsndre Carrick is a 51 y.o. male with an underlying medical history of hypertension, asthma, lower extremity edema, asthma, history of blood clots, and morbid obesity with a BMI of over 80, whose history and physical exam are highly concerning for obstructive sleep apnea (OSA). I had a long chat with the patient about my findings and the diagnosis of OSA, its prognosis and  treatment options. We talked about medical treatments, surgical interventions and non-pharmacological approaches. I explained in particular the risks and ramifications of untreated moderate to severe OSA, especially with respect to developing cardiovascular disease down the Road, including congestive heart failure, difficult to treat hypertension, cardiac arrhythmias, or stroke. Even type 2 diabetes has, in part, been linked to untreated OSA. Symptoms of untreated OSA include daytime sleepiness, memory problems, mood irritability and mood disorder such as depression and anxiety, lack of energy, as well as recurrent headaches, especially morning headaches. We talked about trying to maintain a healthy lifestyle in general, as well as the importance of weight control. I encouraged the patient to eat healthy, exercise daily and keep well hydrated, to keep a scheduled bedtime and wake  time routine, to not skip any meals and eat healthy snacks in between meals. I advised the patient not to drive when feeling sleepy. I recommended the following at this time: sleep study with potential positive airway pressure titration. (We will score hypopneas at 3%).   I explained the sleep test procedure to the patient and also outlined possible surgical and non-surgical treatment options of OSA, including the use of a custom-made dental device (which would require a referral to a specialist dentist or oral surgeon), upper airway surgical options, such as pillar implants, radiofrequency surgery, tongue base surgery, and UPPP (which would involve a referral to an ENT surgeon). Rarely, jaw surgery such as mandibular advancement may be considered.  I also explained the CPAP treatment option to the patient, who indicated that he would be reluctant to try CPAP if the need arises. I explained the importance of being compliant with PAP treatment, not only for insurance purposes but primarily to improve His symptoms, and for the patient's long term health benefit, including to reduce His cardiovascular risks. I answered all his questions today and the patient was in agreement. I would like to see him back after the sleep study is completed and encouraged him to call with any interim questions, concerns, problems or updates.   Thank you very much for allowing me to participate in the care of this nice patient. If I can be of any further assistance to you please do not hesitate to call me at [phone].  Sincerely,   Huston Foley, MD, PhD

## 2016-09-18 ENCOUNTER — Ambulatory Visit: Payer: BC Managed Care – PPO | Admitting: Registered"

## 2016-10-14 ENCOUNTER — Ambulatory Visit (INDEPENDENT_AMBULATORY_CARE_PROVIDER_SITE_OTHER): Payer: BC Managed Care – PPO | Admitting: Neurology

## 2016-10-14 DIAGNOSIS — G4720 Circadian rhythm sleep disorder, unspecified type: Secondary | ICD-10-CM

## 2016-10-14 DIAGNOSIS — G4734 Idiopathic sleep related nonobstructive alveolar hypoventilation: Secondary | ICD-10-CM

## 2016-10-14 DIAGNOSIS — R9431 Abnormal electrocardiogram [ECG] [EKG]: Secondary | ICD-10-CM

## 2016-10-14 DIAGNOSIS — G4733 Obstructive sleep apnea (adult) (pediatric): Secondary | ICD-10-CM | POA: Diagnosis not present

## 2016-10-16 NOTE — Addendum Note (Signed)
Addended by: Huston FoleyATHAR, Alexandre Faries on: 10/16/2016 08:36 AM   Modules accepted: Orders

## 2016-10-16 NOTE — Procedures (Signed)
PATIENT'S NAME:  Seth Wilson, Seth Wilson DOB:      [birth year]      MR#:    [id number]     DATE OF RECORDING: 10/14/2016 REFERRING M.D.: Dr. Sheliah Hatch, PCP: Glenna Durand, MD Study Performed:   Baseline Polysomnogram HISTORY: 51 year old man with a history of hypertension, asthma, lower extremity edema, asthma, history of blood clots, and morbid obesity with a BMI of over 80, who reports snoring and excessive daytime somnolence. He's being evaluated for bariatric surgery. His Epworth sleepiness score is 8 out of 24, fatigue score is 40 out of 63. Weight is 631 pounds with a height of 73 (inches), resulting in a BMI of 83.6 kg/m2. The patient's neck circumference measured 21 inches.  CURRENT MEDICATIONS: Amlodipine, Chlorphen-Phenyleph, Losartan and Warfarin   PROCEDURE:  This is a multichannel digital polysomnogram utilizing the Somnostar 11.2 system.  Electrodes and sensors were applied and monitored per AASM Specifications.   EEG, EOG, Chin and Limb EMG, were sampled at 200 Hz.  ECG, Snore and Nasal Pressure, Thermal Airflow, Respiratory Effort, CPAP Flow and Pressure, Oximetry was sampled at 50 Hz. Digital video and audio were recorded.      BASELINE STUDY  Lights Out was at 22:01 and Lights On at 04:29.  Total recording time (TRT) was 388.5 minutes, with a total sleep time (TST) of  160 minutes.   The patient's sleep latency was 169.5 minutes, which is markedly delayed.  REM latency was 90 minutes, which is normal.  The sleep efficiency was 41.2 %, which is markedly reduced.     SLEEP ARCHITECTURE: WASO (Wake after sleep onset) was 66.5 minutes with several longer periods of wakefulness. There were 20.5 minutes in Stage N1, 113.5 minutes Stage N2, 0 minutes Stage N3 and 26 minutes in Stage REM.  The percentage of Stage N1 was 12.8%, which is increased, Stage N2 was 70.9%, which is increased, Stage N3 was absent, and Stage R (REM sleep) was 16.3%, which is mildly reduced.  The arousals were noted as: 8 were  spontaneous, 0 were associated with PLMs, 279 were associated with respiratory events.    Audio and video analysis did not show any abnormal or unusual movements, behaviors, phonations or vocalizations. The patient took 3 bathroom breaks. Moderate to loud snoring was noted. EKG was in keeping with normal sinus rhythm (NSR), with occasional PVCs noted.  RESPIRATORY ANALYSIS:  There were a total of 288 respiratory events:  65 obstructive apneas, 32 central apneas and 0 mixed apneas with a total of 97 apneas and an apnea index (AI) of 36.4 /hour. There were 191 hypopneas with a hypopnea index of 71.6 /hour. The patient also had 0 respiratory event related arousals (RERAs).      The total APNEA/HYPOPNEA INDEX (AHI) was 108/hour and the total RESPIRATORY DISTURBANCE INDEX was 108 /hour.  47 events occurred in REM sleep and 364 events in NREM. The REM AHI was 108.5 /hour, versus a non-REM AHI of 107.9. The patient spent 46.5 minutes of total sleep time in the supine position and 114 minutes in non-supine.. The supine AHI was 107.1 versus a non-supine AHI of 108.4.  OXYGEN SATURATION & C02:  The Wake baseline 02 saturation was 86%, with the lowest being 71%. Time spent below 89% saturation equaled 69 minutes.  PERIODIC LIMB MOVEMENTS:  The patient had a total of 0 Periodic Limb Movements.  The Periodic Limb Movement (PLM) index was 0 and the PLM Arousal index was 0/hour. Post-study, the patient indicated that sleep was  worse than usual.   IMPRESSION: 1. Obstructive Sleep Apnea (OSA) 2. Nonspecific abnormal EKG  3. Nocturnal hypoxemia 4. Dysfunctions associated with sleep stages or arousal from sleep  RECOMMENDATIONS: 1. This study demonstrates severe obstructive sleep apnea, with a total AHI of 108/hour, REM AHI of 108.5/hour, supine AHI of 107.1/hour and O2 nadir of 71%. There is evidence of nocturnal hypoxemia. He had significant nocturia.  2. Treatment with positive airway pressure in the form of  CPAP is recommended. This will require a full night titration study to optimize therapy. ENT evaluation and/or consultation with a maxillofacial surgeon or dentist may be feasible in some instances.    3. Please note that untreated obstructive sleep apnea carries additional perioperative morbidity. Patients with significant obstructive sleep apnea should receive perioperative PAP therapy and the surgeons and particularly the anesthesiologist should be informed of the diagnosis and the severity of the sleep disordered breathing. 4. This study shows sleep fragmentation and abnormal sleep stage percentages; these are nonspecific findings and per se do not signify an intrinsic sleep disorder or a cause for the patient's sleep-related symptoms. Causes include (but are not limited to) the first night effect of the sleep study, circadian rhythm disturbances, medication effect or an underlying mood disorder or medical problem.  5. The study showed occasional PVCs on single lead EKG; clinical correlation is recommended and consultation with cardiology may be feasible.  6. The patient should be cautioned not to drive, work at heights, or operate dangerous or heavy equipment when tired or sleepy. Review and reiteration of good sleep hygiene measures should be pursued with any patient. 7. The patient will be seen in follow-up by Dr. Frances FurbishAthar at [facility] for discussion of the test results and further management strategies. The referring provider will be notified of the test results.  I certify that I have reviewed the entire raw data recording prior to the issuance of this report in accordance with the Standards of Accreditation of the American Academy of Sleep Medicine (AASM)   Huston FoleySaima Shenique Childers, MD, PhD Diplomat, American Board of Psychiatry and Neurology (Neurology and Sleep Medicine)

## 2016-10-16 NOTE — Progress Notes (Signed)
Patient referred by Dr. Sheliah HatchKinsinger, surgeon, seen by me on 09/03/16, diagnostic PSG on 10/14/16.    Please call and notify the patient that the recent sleep study did confirm the diagnosis of severe obstructive sleep apnea and that I recommend treatment for this in the form of CPAP. This will require a repeat sleep study for proper titration and mask fitting. Please explain to patient and arrange for a CPAP titration study. I have placed an order in the chart. Thanks, and [facility].  Huston FoleySaima Bristol Osentoski, MD, PhD [facility])

## 2016-10-21 ENCOUNTER — Telehealth: Payer: Self-pay

## 2016-10-21 NOTE — Telephone Encounter (Signed)
I called pt. I advised pt that Dr. Frances FurbishAthar reviewed their sleep study results and found that pt has severe osa and recommends treatment in the form of a cpap. Dr. Frances FurbishAthar recommends that pt return for a repeat sleep study in order to properly titrate the cpap and ensure a good mask fit. Pt says that he does not want to "mess with " a cpap. I explained that the treatment for severe osa is a cpap but pt insists that he wants to explore alternatives to cpap, if there are any. I advised him that Dr. Frances FurbishAthar is out of the office this week and will review this request upon her return next week. Pt asked that I send a copy of his sleep study to Dr. Sheliah HatchKinsinger. Pt verbalized understanding.

## 2016-10-21 NOTE — Telephone Encounter (Signed)
-----   Message from Huston FoleySaima Athar, MD sent at 10/16/2016  8:36 AM EDT ----- Patient referred by Dr. Sheliah HatchKinsinger, surgeon, seen by me on 09/03/16, diagnostic PSG on 10/14/16.    Please call and notify the patient that the recent sleep study did confirm the diagnosis of severe obstructive sleep apnea and that I recommend treatment for this in the form of CPAP. This will require a repeat sleep study for proper titration and mask fitting. Please explain to patient and arrange for a CPAP titration study. I have placed an order in the chart. Thanks, and [facility].  Huston FoleySaima Athar, MD, PhD [facility])

## 2016-10-28 NOTE — Telephone Encounter (Signed)
Please explain to patient that this degree of OSA is best treated with CPAP and I encourage him strongly to give this treatment a try. He may be able to tolerate it better than he anticipates at this time. Especially, in light of upcoming elective surgery for weight loss, surgical treatment for sleep apnea is not recommended. In addition, dental treatment approaches with an oral appliance is not enough for severe obstructive sleep apnea. Realistically speaking, there are no reasonable alternatives in his case especially as he has not tried and failed CPAP.

## 2016-10-29 NOTE — Telephone Encounter (Signed)
I called pt and left a detailed message on his cell phone, per DPR, advising him of this information, and asking him to call back and let us know if he will proceed with his cpap titration study.

## 2017-03-13 ENCOUNTER — Telehealth: Payer: Self-pay | Admitting: Neurology

## 2017-03-13 DIAGNOSIS — G4733 Obstructive sleep apnea (adult) (pediatric): Secondary | ICD-10-CM

## 2017-03-13 NOTE — Telephone Encounter (Signed)
Patient with severe obstructive sleep apnea, willing to schedule CPAP titration now.

## 2017-03-13 NOTE — Telephone Encounter (Signed)
Dr. Sheliah HatchKinsinger reached out to us to call the pt regarding a cpap titration.  I called pt. He reports that he is agreeable to coming in for a cpap titration study but will need a small mask, because he is claustrophobic. I reminded him that Dr. Frances FurbishAthar recommends a cpap titration and there is no other reasonable alternatives in his case since he has not tried and failed cpap. Pt is agreeable to a titration study and knows that our sleep lab will call him to schedule his study after we we file for approval with his insurance. Pt is asking to be scheduled for the beginning of the next year. Will send to our sleep lab.

## 2017-04-11 ENCOUNTER — Telehealth: Payer: Self-pay | Admitting: Neurology

## 2017-04-11 NOTE — Telephone Encounter (Signed)
We have attempted to call the patient 2 times to schedule sleep study. Patient has been unavailable at the phone numbers we have on file and has not returned our calls. At this point we will send a letter asking pt to please contact the sleep lab to schedule their sleep study. If patient calls back we will schedule them for their sleep study. ° °

## 2018-05-29 IMAGING — DX DG CHEST 2V
2 series · 2 of 2 positions shown · non-contrast
Comparison: 09/17/2011

CLINICAL DATA: Morbid obesity, unspecified obesity type (HCC)
BVV.DF (AMW-VY-CM)

Preop exam.
EXAM:
CHEST  2 VIEW

[chest pa]
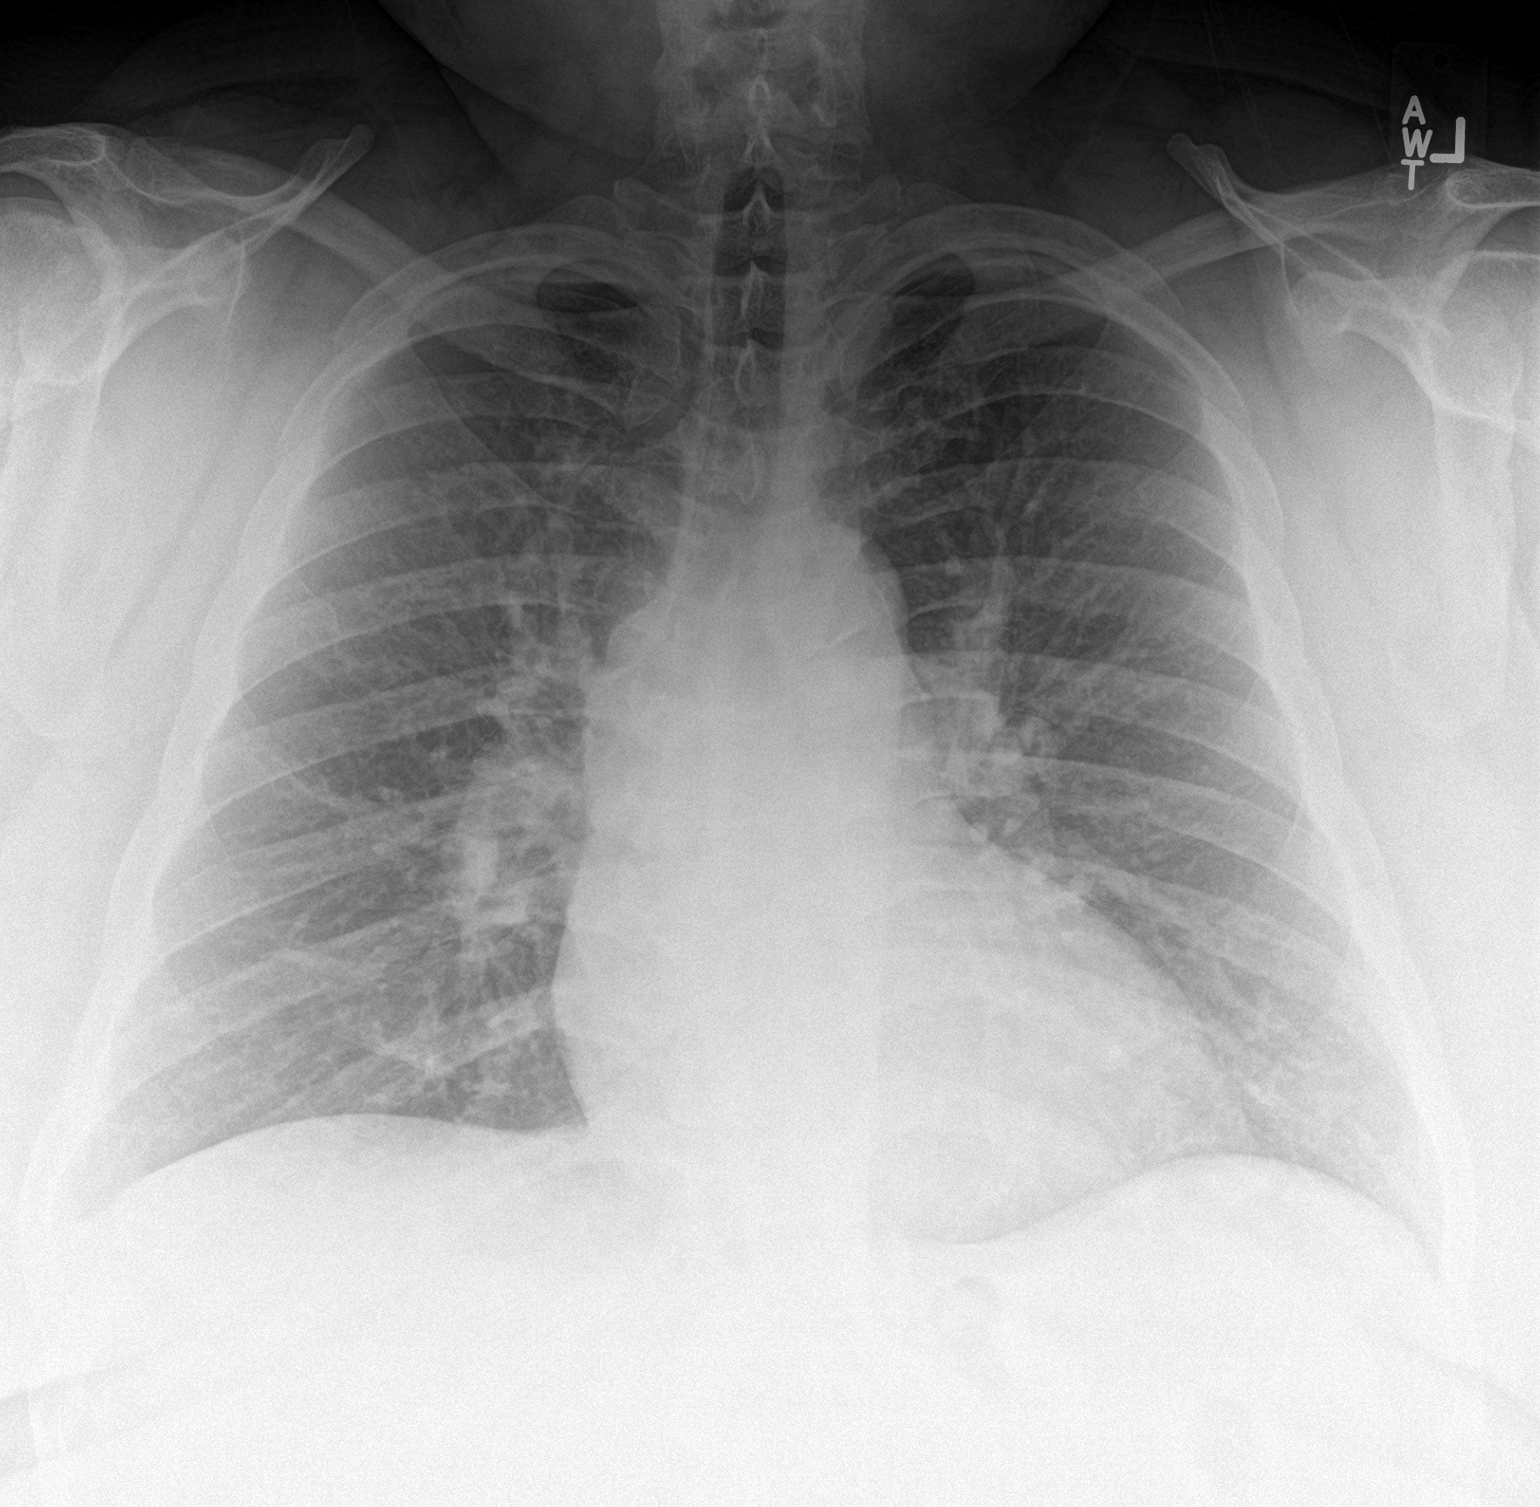

[chest lat]
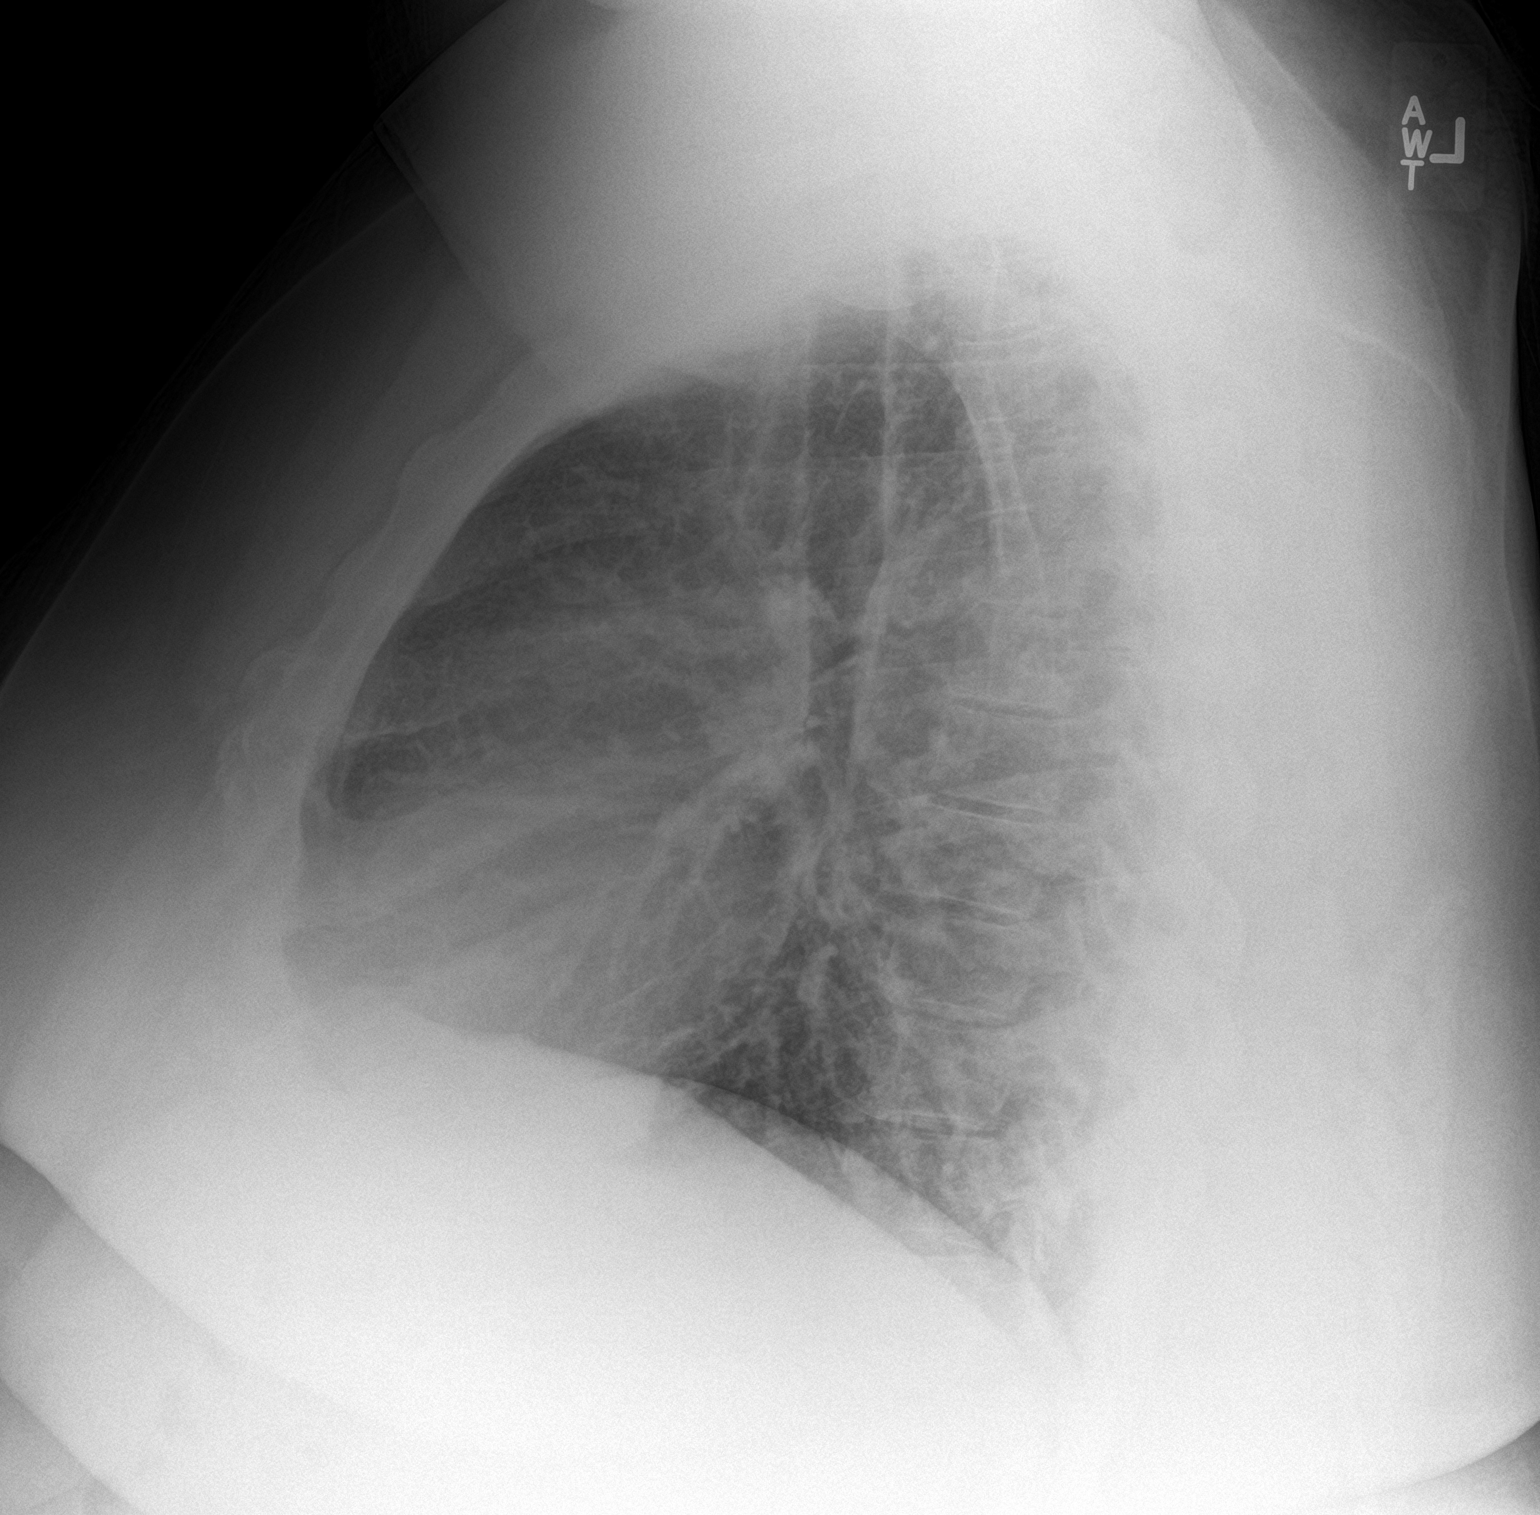

[2 of 2 positions shown; findings below may reference images not displayed]

FINDINGS: The cardiac silhouette is normal in size and configuration. Normal
mediastinal and hilar contours.

Clear lungs.  No pleural effusion or pneumothorax.

Skeletal structures are unremarkable.
IMPRESSION: No active cardiopulmonary disease.

## 2018-05-29 IMAGING — RF DG UGI W/ KUB
12 series · 12 of 12 positions shown · non-contrast
Comparison: None.

CLINICAL DATA: Preop for gastric sleeve procedure.  Morbid obesity.

EXAM:
UPPER GI SERIES WITHOUT KUB
TECHNIQUE: Routine upper GI series was performed with thin barium.
FLUOROSCOPY TIME:  Fluoroscopy Time:  2.8 minutes
Radiation Exposure Index (if provided by the fluoroscopic device):
58.5 mGy
Number of Acquired Spot Images: 0

[Series 1: fluoro_barium 2fps_bw · 0.17mm/px · 1 of 1 slices shown (1 of 2)]
[im 1/1]
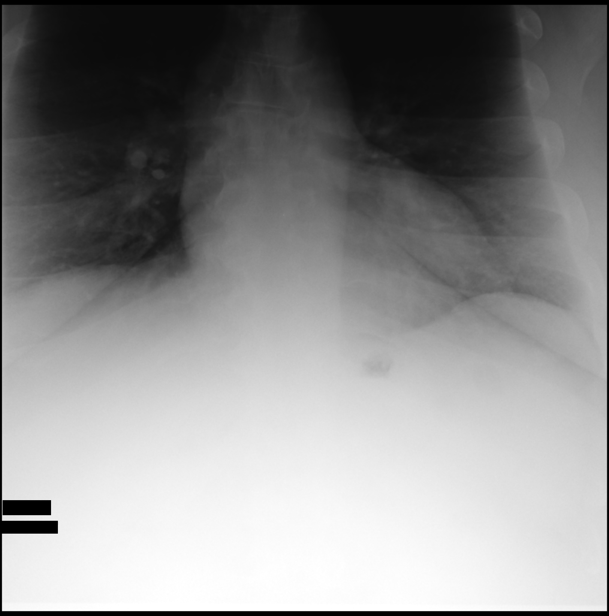

[Series 2: fluoro_barium 2fps_bw · 0.17mm/px · 1 of 1 slices shown (2 of 2)]
[im 1/1]
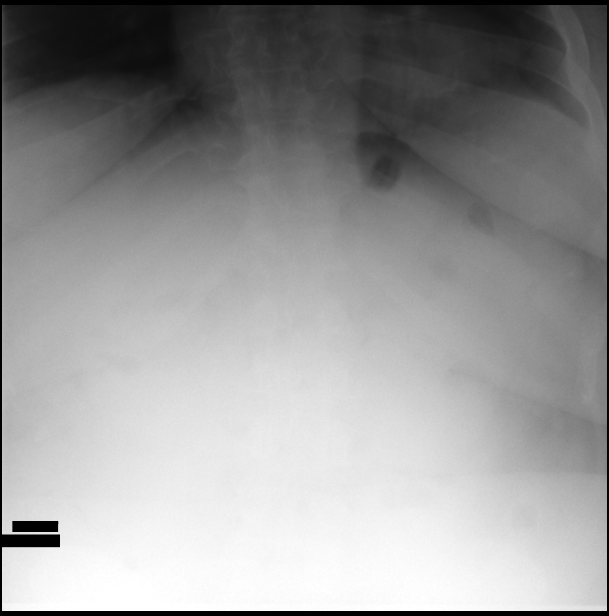

[Series 3: cp_standard · 0.25mm/px · 1 of 1 slices shown (1 of 10)]
[im 1/1]
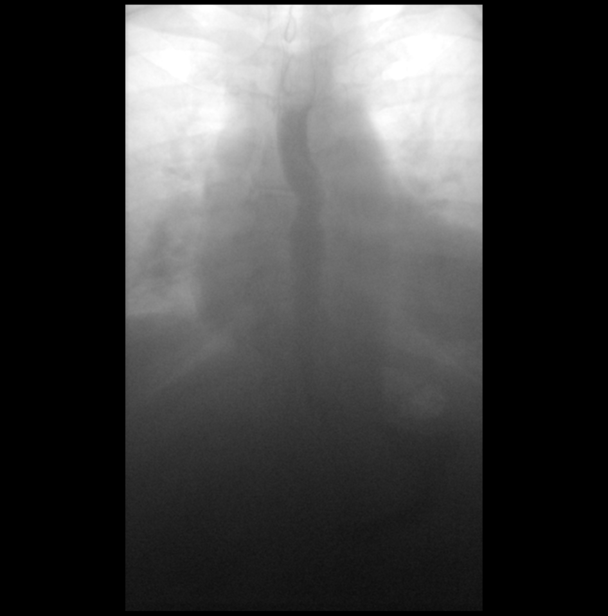

[Series 4: cp_standard · 0.25mm/px · 1 of 1 slices shown (2 of 10)]
[im 1/1]
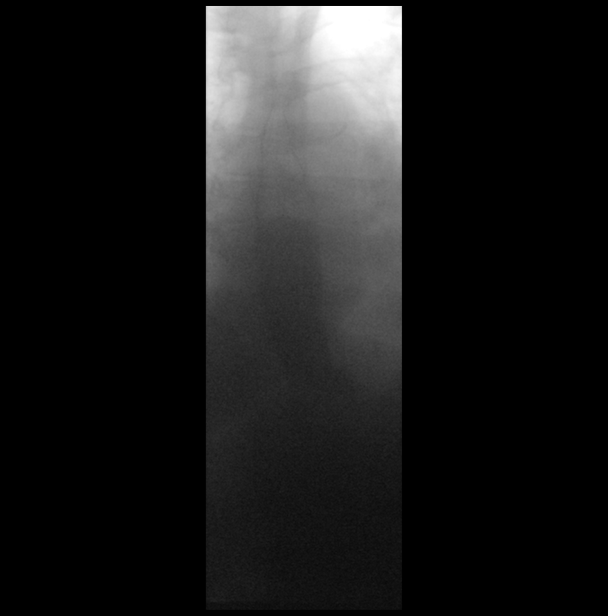

[Series 5: cp_standard · 0.25mm/px · 1 of 1 slices shown (3 of 10)]
[im 1/1]
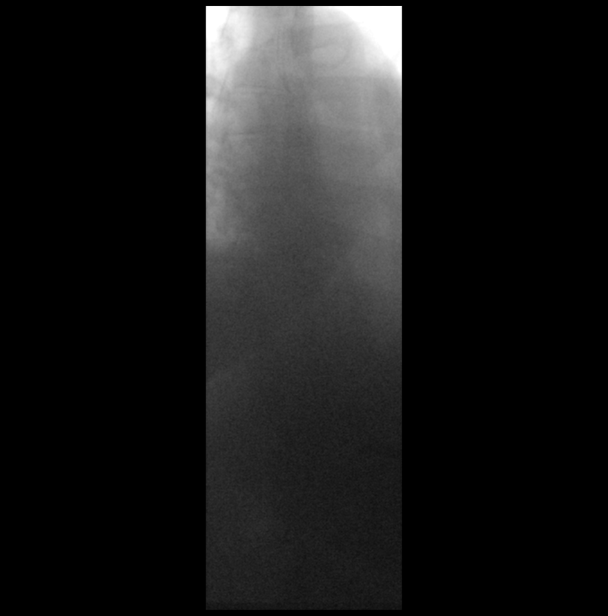

[Series 6: cp_standard · 0.17mm/px · 1 of 1 slices shown (4 of 10)]
[im 1/1]
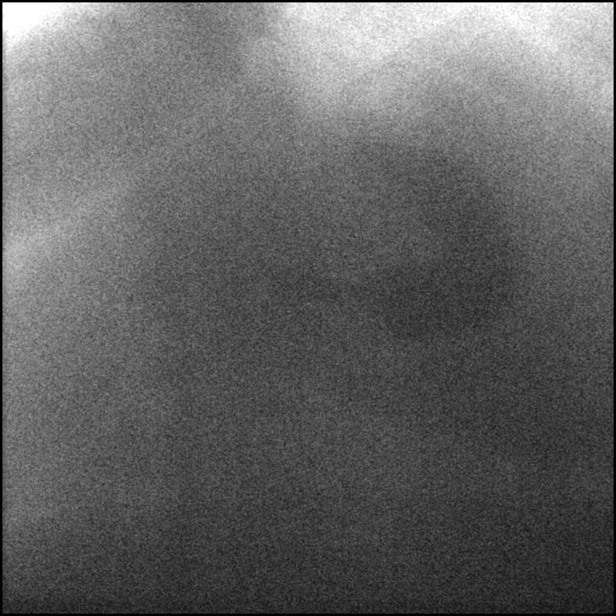

[Series 7: cp_standard · 0.25mm/px · 1 of 1 slices shown (5 of 10)]
[im 1/1]
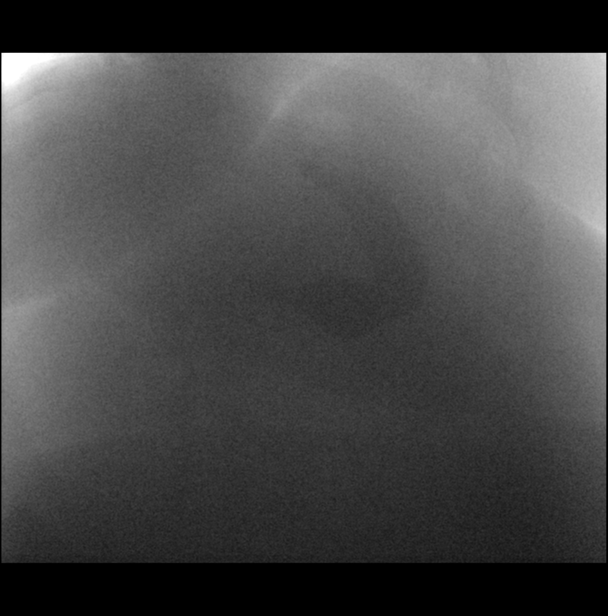

[Series 8: cp_standard · 0.25mm/px · 1 of 1 slices shown (6 of 10)]
[im 1/1]
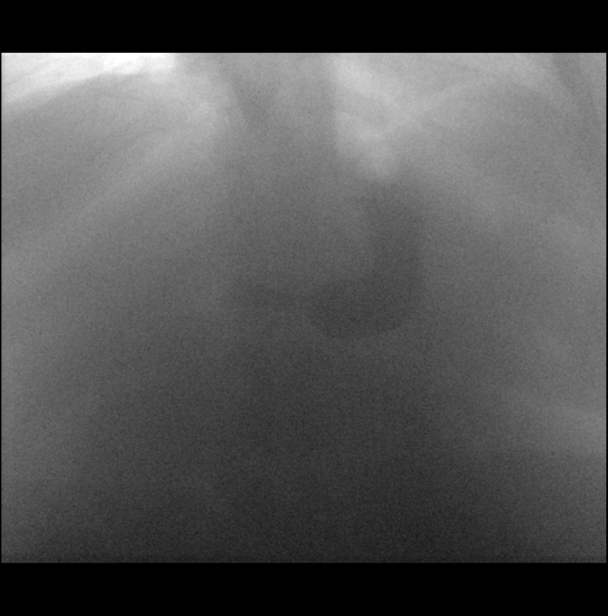

[Series 9: cp_standard · 0.25mm/px · 1 of 1 slices shown (7 of 10)]
[im 1/1]
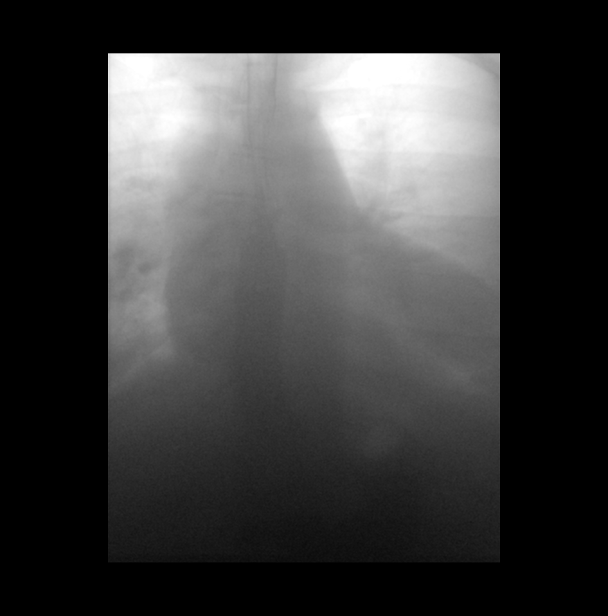

[Series 10: cp_standard · 0.25mm/px · 1 of 1 slices shown (8 of 10)]
[im 1/1]
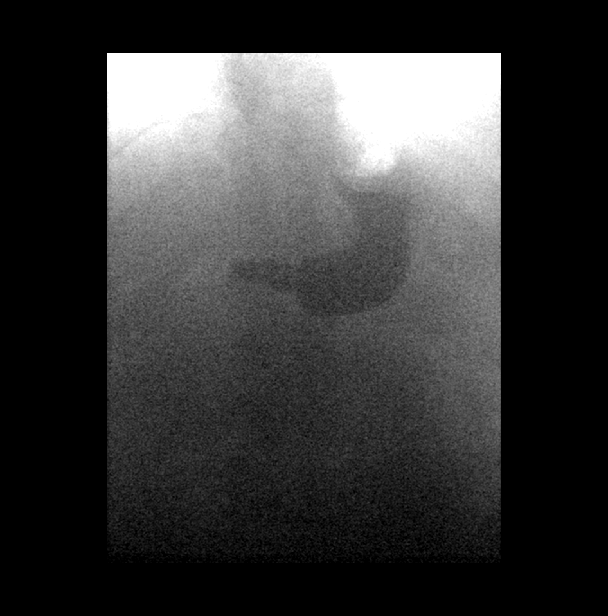

[Series 11: cp_standard · 0.25mm/px · 1 of 1 slices shown (9 of 10)]
[im 1/1]
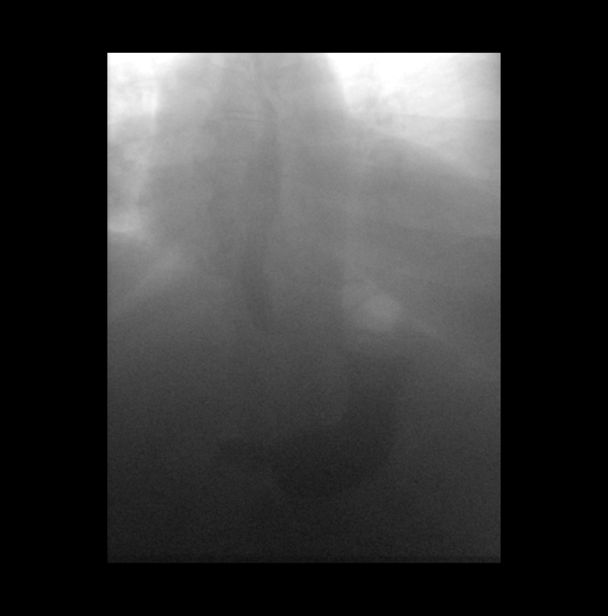

[Series 12: cp_standard · 0.25mm/px · 1 of 1 slices shown (10 of 10)]
[im 1/1]
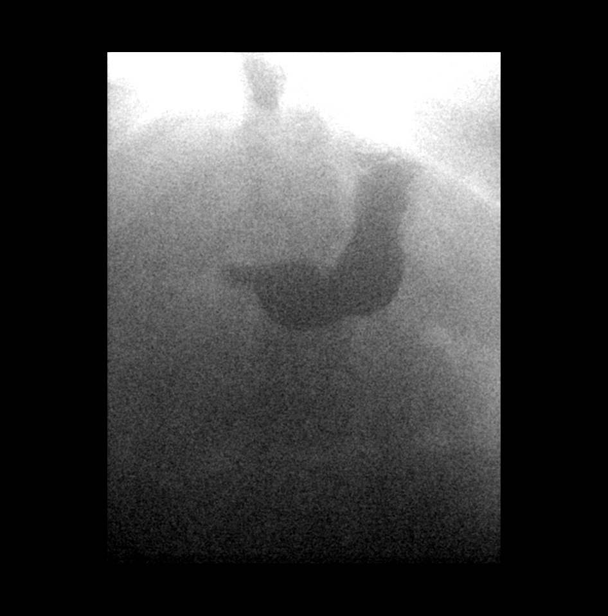

[12 of 12 positions shown; findings below may reference images not displayed]

FINDINGS: Severely degraded,secondary to patient body habitus. Exam was
performed only in a standing position, as patient was heavier than
the table limit. Limited evaluation of the esophagus demonstrates no
gross narrowing. There is contrast stasis in the lower esophagus,
suggesting dysmotility.

The stomach is grossly normal, without evidence of hiatal hernia.
The stomach did not empty, and therefore the duodenal bulb and
C-loop could not be evaluated.
IMPRESSION: 1. Severely degraded exam secondary to patient body habitus.
2. Grossly normal appearance of the stomach, without hiatal hernia.
3. Lack of opacification of the duodenum.
4. Contrast stasis in the lower esophagus, suggesting dysmotility.
